# Patient Record
Sex: Male | Born: 1955 | Race: White | Hispanic: No | Marital: Married | Smoking: Current every day smoker
Health system: Southern US, Community
[De-identification: ages and names within clinical notes are randomized; demographics above are authoritative.]

## PROBLEM LIST (undated history)

## (undated) DIAGNOSIS — Z889 Allergy status to unspecified drugs, medicaments and biological substances status: Secondary | ICD-10-CM

## (undated) DIAGNOSIS — I10 Essential (primary) hypertension: Secondary | ICD-10-CM

## (undated) DIAGNOSIS — E785 Hyperlipidemia, unspecified: Secondary | ICD-10-CM

## (undated) DIAGNOSIS — K219 Gastro-esophageal reflux disease without esophagitis: Secondary | ICD-10-CM

## (undated) DIAGNOSIS — M1990 Unspecified osteoarthritis, unspecified site: Secondary | ICD-10-CM

## (undated) HISTORY — PX: TESTICLE SURGERY: SHX794

## (undated) HISTORY — PX: CATARACT EXTRACTION, BILATERAL: SHX1313

## (undated) HISTORY — PX: HERNIA REPAIR: SHX51

## (undated) HISTORY — PX: KNEE ARTHROSCOPY: SUR90

## (undated) HISTORY — DX: Hyperlipidemia, unspecified: E78.5

## (undated) HISTORY — PX: VASECTOMY: SHX75

---

## 2010-12-01 HISTORY — PX: JOINT REPLACEMENT: SHX530

## 2010-12-26 LAB — COMPREHENSIVE METABOLIC PANEL
ALT: 32 U/L (ref 0–53)
Alkaline Phosphatase: 59 U/L (ref 39–117)
BUN: 10 mg/dL (ref 6–23)
Chloride: 105 mEq/L (ref 96–112)
Glucose, Bld: 96 mg/dL (ref 70–99)
Potassium: 4.1 mEq/L (ref 3.5–5.1)
Sodium: 140 mEq/L (ref 135–145)
Total Bilirubin: 0.8 mg/dL (ref 0.3–1.2)
Total Protein: 7.2 g/dL (ref 6.0–8.3)

## 2010-12-26 LAB — URINALYSIS, ROUTINE W REFLEX MICROSCOPIC
Bilirubin Urine: NEGATIVE
Hgb urine dipstick: NEGATIVE
Ketones, ur: NEGATIVE mg/dL
Nitrite: NEGATIVE
Specific Gravity, Urine: 1.019 (ref 1.005–1.030)
Urine Glucose, Fasting: NEGATIVE mg/dL
pH: 6 (ref 5.0–8.0)

## 2010-12-26 LAB — CBC
HCT: 43.1 % (ref 39.0–52.0)
Hemoglobin: 15.2 g/dL (ref 13.0–17.0)
MCH: 32.3 pg (ref 26.0–34.0)
MCV: 91.7 fL (ref 78.0–100.0)
RBC: 4.7 MIL/uL (ref 4.22–5.81)
WBC: 7.4 10*3/uL (ref 4.0–10.5)

## 2010-12-26 LAB — SURGICAL PCR SCREEN: MRSA, PCR: POSITIVE — AB

## 2010-12-26 LAB — PROTIME-INR: Prothrombin Time: 13.6 seconds (ref 11.6–15.2)

## 2011-01-02 ENCOUNTER — Inpatient Hospital Stay (HOSPITAL_COMMUNITY)
Admission: RE | Admit: 2011-01-02 | Discharge: 2011-01-05 | DRG: 470 | Disposition: A | Discharge status: Home Health | Payer: 59 | Attending: Orthopedic Surgery | Admitting: Orthopedic Surgery

## 2011-01-02 DIAGNOSIS — K219 Gastro-esophageal reflux disease without esophagitis: Secondary | ICD-10-CM | POA: Diagnosis present

## 2011-01-02 DIAGNOSIS — I10 Essential (primary) hypertension: Secondary | ICD-10-CM | POA: Diagnosis present

## 2011-01-02 DIAGNOSIS — F17200 Nicotine dependence, unspecified, uncomplicated: Secondary | ICD-10-CM | POA: Diagnosis present

## 2011-01-02 DIAGNOSIS — M1710 Unilateral primary osteoarthritis, unspecified knee: Principal | ICD-10-CM | POA: Diagnosis present

## 2011-01-02 DIAGNOSIS — E669 Obesity, unspecified: Secondary | ICD-10-CM | POA: Diagnosis present

## 2011-01-02 DIAGNOSIS — E7800 Pure hypercholesterolemia, unspecified: Secondary | ICD-10-CM | POA: Diagnosis present

## 2011-01-02 LAB — ABO/RH: ABO/RH(D): A POS

## 2011-01-02 LAB — TYPE AND SCREEN: ABO/RH(D): A POS

## 2011-01-03 LAB — CBC
Hemoglobin: 12.3 g/dL — ABNORMAL LOW (ref 13.0–17.0)
MCHC: 34.1 g/dL (ref 30.0–36.0)
RDW: 12.8 % (ref 11.5–15.5)
WBC: 10.5 10*3/uL (ref 4.0–10.5)

## 2011-01-03 LAB — BASIC METABOLIC PANEL
Calcium: 8.6 mg/dL (ref 8.4–10.5)
GFR calc Af Amer: >60 mL/min (ref >60)
GFR calc non Af Amer: >60 mL/min (ref >60)
Potassium: 4 mEq/L (ref 3.5–5.1)
Sodium: 137 mEq/L (ref 135–145)

## 2011-01-04 LAB — CBC
Hemoglobin: 12.3 g/dL — ABNORMAL LOW (ref 13.0–17.0)
RBC: 3.93 MIL/uL — ABNORMAL LOW (ref 4.22–5.81)

## 2011-01-04 LAB — BASIC METABOLIC PANEL
CO2: 28 mEq/L (ref 19–32)
Chloride: 102 mEq/L (ref 96–112)
GFR calc Af Amer: >60 mL/min (ref >60)
Potassium: 4.3 mEq/L (ref 3.5–5.1)
Sodium: 136 mEq/L (ref 135–145)

## 2011-01-05 LAB — CBC
HCT: 33.6 % — ABNORMAL LOW (ref 39.0–52.0)
Hemoglobin: 11.5 g/dL — ABNORMAL LOW (ref 13.0–17.0)
MCH: 31.3 pg (ref 26.0–34.0)
MCV: 91.3 fL (ref 78.0–100.0)
RBC: 3.68 MIL/uL — ABNORMAL LOW (ref 4.22–5.81)

## 2011-01-15 NOTE — Op Note (Signed)
NAMEOLAND, ARQUETTE                ACCOUNT NO.:  [id number]  MEDICAL RECORD NO.:  [id number]           PATIENT TYPE:  I  LOCATION:  0006                         FACILITY:  [facility]  PHYSICIAN:  Ollen Gross, M.D.    DATE OF BIRTH:  March 22, 1956  DATE OF PROCEDURE:  01/02/2011 DATE OF DISCHARGE:                              OPERATIVE REPORT   PREOPERATIVE DIAGNOSIS:  Osteoarthritis, left knee.  POSTOPERATIVE DIAGNOSIS:  Osteoarthritis, left knee.  PROCEDURE:  Left total knee arthroplasty.  SURGEON:  Ollen Gross, M.D.  ASSISTANT:  Rozell Searing, PA-C  ANESTHESIA:  Spinal, then conversion to general.  ESTIMATED BLOOD LOSS:  Minimal.  DRAINS:  Hemovac x1.  TOURNIQUET TIME:  46 minutes at 300 mmHg.  COMPLICATIONS:  None.  CONDITION:  Stable to recovery.  CLINICAL NOTE:  Jeremy Calderon is a 55 year old male with advanced end-stage arthritis of his left knee with progressively worsening pain and dysfunction.  He has failed nonoperative management and presents for left total knee arthroplasty.  PROCEDURE IN DETAIL:  After successful administration of spinal anesthetic, a tourniquet was placed on the left thigh, left lower extremity prepped and draped in the usual sterile fashion.  The left lower extremity is wrapped in Esmarch, knee flexed, tourniquet inflated to 300 mmHg.  I attempted to pinch the skin to see if the spinal was working.  It appeared to be working; however, when I made the midline incision, he felt it and had severe pain.  He subsequently converted over to a general anesthetic.  We then completed the skin incision with a 10 blade through subcutaneous tissue to the extensor mechanism.  Fresh blade was used to make a medial parapatellar arthrotomy.  Soft tissue on the proximal medial tibia subperiosteally elevated to the joint line with the knife into the semimembranosus bursa with a Cobb elevator. Soft tissue laterally is elevated with attention being paid to avoid  the patellar tendon on tibial tubercle.  Patella was everted, knee flexed 90 degrees, and ACL and PCL removed.  Drill was used create a starting hole in the distal femur, and the canal was thoroughly irrigated.  The 5- degree left valgus alignment guide is placed and the distal femoral cutting blocks pinned to remove 11 mm off the distal femur.  Distal femoral resection is made with an oscillating saw.  Sizing blocks placed, size 4 is most appropriate.  Rotations marked off the epicondylar axis.  Size 4 cutting blocks placed and the anterior- posterior and chamfer cuts made.  The tibia subluxed forward and the menisci are removed.  Extramedullary tibial alignment guide is placed referencing proximally at the medial aspect of the tibial tubercle and distally along the second metatarsal axis and tibial crest.  The block is pinned to remove 2 mm off the more deficient medial side.  Tibial resection is made with an oscillating saw.  Size 5 was the most appropriate tibial component, and the proximal tibia was prepared to modular drill and keel punch for the size 5. Femoral preparation is completed with the intercondylar cut for the size 4.  Size 5 mobile bearing tibial trial, size 4  posterior stabilized femoral trial, and a 12.5 mm posterior stabilized rotating platform insert trial are placed.  With the 12.5, full extension is achieved with excellent varus-valgus and anterior-posterior balance throughout full range of motion.  Patella was everted, thickness measured to be 25 mm.  Freehand resection taken to 13 mm, 41 template is placed, lug holes were drilled, trial patella was placed and it tracks normally.  Osteophytes removed off the posterior femur with the trial in place.  All trials were removed and the cut bone surfaces are prepared with pulsatile lavage. Cement was mixed and once ready for implantation, the size 5 mobile bearing tibial tray, size 4 posterior stabilized femur, and  41 patella were cemented into place, and the patella was held with a clamp.  Trial 12.5 inserts placed, knee held in full extension.  All extruded cement removed.  When the cement was fully hardened, then the permanent 12.5 mm posterior stabilized rotating platform insert is placed in the tibial tray.  Wound is copiously irrigated with saline solution and the arthrotomy closed over Hemovac drain with interrupted #1 PDS.  Flexion against gravity is 140 degrees.  Patella tracks normally.  Tourniquet was then released at the total time of 46 minutes.  Subcutaneous was then closed with interrupted 2-0 Vicryl, subcuticular running 4-0 Monocryl.  Catheter for Marcaine pain pump is placed and the pump is initiated.  Incision was then cleaned and dried, and Steri-Strips and a bulky sterile dressing were applied.  He was then placed into a knee immobilizer, awakened, and transported to recovery in stable condition.     Ollen Gross, M.D.     FA/MEDQ  D:  01/02/2011  T:  01/02/2011  Job:  [id number]  Electronically Signed by Ollen Gross M.D. on 01/15/2011 02:54:09 PM

## 2011-01-17 NOTE — H&P (Signed)
Jeremy Calderon, Jeremy Calderon NO.:  [id number]  MEDICAL RECORD NO.:  [id number]          PATIENT TYPE:  LOCATION:                                 FACILITY:  PHYSICIAN:  Ollen Gross, M.D.         DATE OF BIRTH:  DATE OF ADMISSION:  01/02/2011 DATE OF DISCHARGE:                             HISTORY & PHYSICAL   CHIEF COMPLAINT:  Left knee pain.  HISTORY OF PRESENT ILLNESS:  The patient is a 55 year old male who has been seen by Dr. Lequita Halt for ongoing left knee problems.  His knee pain has progressively gotten worse with time.  He has known end-stage arthritis noted be bone on bone.  He is felt be a good candidate for surgery.  Risks and benefits of this have been discussed and he elects to proceed with surgery.  ALLERGIES:  No known drug allergies.  CURRENT MEDICATIONS:  Azor, probiotic, omeprazole, Crestor, B12.  He also has stopped his supplements including Equalactin, fish oil, glucosamine chondroitin, saw palmetto, and aspirin.  PAST MEDICAL HISTORY:  Hypertension, hypercholesterolemia, cataracts status post removal, reflux disease, esophageal strictures requiring dilatation, diverticulosis, history of thrombocytopenia.  PAST SURGICAL HISTORY:  Stomach hernia repair in 1985, arthroscopic knee surgery in the 1980s, testicular torsion knot excision, cataract removal, vasectomy EGD and colonoscopy.  FAMILY HISTORY:  Father deceased at age 44 with hypertension and heart attack.  Mother living at 76 with hypertension, thyroid disease, sleep apnea.  He has a brother living with hypertension, another brother with heart attack.  SOCIAL HISTORY:  Married.  Works as a Nutritional therapist.  Current smoker, smoked for 20 years, stopped 15 years ago, started back up 4 years ago.  No alcohol.  Wife and daughter will be assisting with care after surgery. He has 5 steps entering his home, does have a living will.  REVIEW OF SYSTEMS:  GENERAL:  No fevers, chills, night sweats.   NEURO: No seizures, syncope, or paralysis.  RESPIRATORY:  No shortness breath, productive cough or hemoptysis.  RESPIRATORY:  Little bit of smoker's cough.  No productive cough or hemoptysis.  CARDIOVASCULAR:  No chest pain, no orthopnea.  GI:  No nausea, vomiting, diarrhea, or constipation.  Occasional reflux.  GU: Little bit of frequency.  No dysuria or hematuria.  MUSCULOSKELETAL:  Knee pain.  PHYSICAL EXAMINATION:  VITAL SIGNS:  Pulse 76, respirations 14, blood pressure 146/82. GENERAL:  A 55 year old white male, well nourished, well developed, in no acute distress.  He is alert, oriented, cooperative.  He is currently accompanied by his wife. HEENT:  Normocephalic, atraumatic.  Pupils round are reactive.  EOMs intact. NECK:  Supple. CHEST:  He has little bit of faint rhonchi on the left sided chest but does clear with coughing. HEART:  Regular rate and rhythm without murmur. ABDOMEN:  Soft, nontender.  Bowel sounds present. RECTAL:  Not done, not pertinent to present illness. BREASTS:  Not done, not pertinent to present illness. GENITALIA:  Not done, not pertinent to present illness. EXTREMITIES:  Left knee range of motion 5 to 110, marked crepitus, little bit of pseudolaxity but no true  instability.  IMPRESSION:  Osteoarthritis, left knee.  PLAN:  The patient admitted to [facility], undergo left total knee replacement arthroplasty.  Surgery will be performed by Dr. Ollen Gross.     Alexzandrew L. Julien Girt, P.A.C.   ______________________________ Ollen Gross, M.D.    ALP/MEDQ  D:  01/09/2011  T:  01/09/2011  Job:  [id number]  cc:   Feliciana Rossetti, MD Fax: [phone]  Electronically Signed by Patrica Duel P.A.C. on 01/16/2011 11:01:00 AM Electronically Signed by Ollen Gross M.D. on 01/17/2011 08:50:00 AM

## 2011-02-24 NOTE — Discharge Summary (Signed)
NAMERIGEL, FILSINGER                ACCOUNT NO.:  [id number]  MEDICAL RECORD NO.:  [id number]           PATIENT TYPE:  I  LOCATION:  1620                         FACILITY:  [facility]  PHYSICIAN:  Ollen Gross, M.D.    DATE OF BIRTH:  22-Aug-1956  DATE OF ADMISSION:  01/02/2011 DATE OF DISCHARGE:  01/05/2011                              DISCHARGE SUMMARY   ADMITTING DIAGNOSES: 1. Osteoarthritis, left knee. 2. Hypertension. 3. Hypercholesterolemia. 4. Cataracts. 5. Reflux disease. 6. Esophageal strictures, requiring dilatation. 7. Diverticulosis. 8. History of thrombocytopenia.  DISCHARGE DIAGNOSES: 1. Osteoarthritis, left knee, status post left total knee replacement     arthroplasty. 2. Hypertension. 3. Hypercholesterolemia. 4. Cataracts. 5. Reflux disease. 6. Esophageal strictures, requiring dilatation. 7. Diverticulosis. 8. History of thrombocytopenia.  PROCEDURE:  January 02, 2011, left total knee.  SURGEON:  Ollen Gross, M.D.  ASSISTANT:  Rozell Searing, [facility].  ANESTHESIA:  Spinal, which had to be converted to a general.  TOURNIQUET TIME:  46 minutes.  CONSULTATIONS:  None.  BRIEF HISTORY:  Jeremy Calderon is a 55 year old male with end-stage advanced arthritis of left knee, progressive worsening pain and dysfunction, now presents for total knee arthroplasty.  LABORATORY DATA:  Preop CBC showed a hemoglobin of 15.2, hematocrit of 43.1, white cell count 7.4, platelets 167.  Postop hemoglobin down to 12.3 and then last hemoglobin 11.5 and hematocrit 33.6.  PT/INR 13.6/1.02 with a PTT of 33.  Chem panel on admission all within normal limits.  Serial BMETs were followed for 48 hours.  Electrolytes remained within normal limits.  Glucose did go up to 96 to 123, back down to 117.  HOSPITAL COURSE:  The patient was admitted to [facility], taken to OR, underwent above-stated procedure without complication.  The patient tolerated the procedure well and later  transferred to the recovery room and then to orthopedic floor.  Started on p.o. and IV analgesics for pain control following surgery.  Given 24 hours postop antibiotics.  Actually, doing pretty well on the morning of day #1, started therapy, getting up.  Hemoglobin was 12.3.  Hemovac drain was pulled.  He was started back on his home meds.  Blood pressure was stable.  By day #2, his hemoglobin was stable at 12.  Dressing change and incision looked good.  Wound was healing well.  By day #3, he was meeting his goals, tolerating his meds, and was discharged home at that point.  DISCHARGE PLAN: 1. The patient was discharged home on January 05, 2011. 2. Discharge diagnoses, please see above. 3. Discharge meds, Robaxin, Percocet, Xarelto, continue baby aspirin,     Azor, Crestor, and loratadine.  DIET:  Heart-healthy diet.  ACTIVITY:  Weightbearing as tolerated, total knee protocol.  Home health PT, home nursing.  FOLLOWUP:  2 weeks.  DISPOSITION:  Home.  CONDITION UPON DISCHARGE:  Improved.     Alexzandrew L. Julien Girt, P.A.C.   ______________________________ Ollen Gross, M.D.    ALP/MEDQ  D:  02/06/2011  T:  02/07/2011  Job:  [id number]  cc:   Feliciana Rossetti, MD Fax: [phone]  Electronically Signed by Patrica Duel P.A.C. on 02/21/2011  10:27:19 AM Electronically Signed by Ollen Gross M.D. on 02/24/2011 07:10:16 AM

## 2013-07-14 ENCOUNTER — Other Ambulatory Visit: Payer: Self-pay | Admitting: Orthopedic Surgery

## 2013-07-14 NOTE — Progress Notes (Signed)
Need orders in EPIC.  Surgery scheduled for 08/03/13.  Thank You.  

## 2013-07-20 ENCOUNTER — Encounter (HOSPITAL_COMMUNITY): Payer: Self-pay | Admitting: Pharmacy Technician

## 2013-07-26 ENCOUNTER — Encounter (HOSPITAL_COMMUNITY): Payer: Self-pay

## 2013-07-26 ENCOUNTER — Encounter (HOSPITAL_COMMUNITY)
Admission: RE | Admit: 2013-07-26 | Discharge: 2013-07-26 | Disposition: A | Discharge status: Home / Self Care | Payer: 59 | Source: Ambulatory Visit | Attending: Orthopedic Surgery | Admitting: Orthopedic Surgery

## 2013-07-26 ENCOUNTER — Ambulatory Visit (HOSPITAL_COMMUNITY)
Admission: RE | Admit: 2013-07-26 | Discharge: 2013-07-26 | Disposition: A | Discharge status: Home / Self Care | Payer: 59 | Source: Ambulatory Visit | Attending: Orthopedic Surgery | Admitting: Orthopedic Surgery

## 2013-07-26 DIAGNOSIS — Z01810 Encounter for preprocedural cardiovascular examination: Secondary | ICD-10-CM | POA: Insufficient documentation

## 2013-07-26 DIAGNOSIS — Z01812 Encounter for preprocedural laboratory examination: Secondary | ICD-10-CM | POA: Insufficient documentation

## 2013-07-26 DIAGNOSIS — R05 Cough: Secondary | ICD-10-CM | POA: Insufficient documentation

## 2013-07-26 DIAGNOSIS — Z01818 Encounter for other preprocedural examination: Secondary | ICD-10-CM | POA: Insufficient documentation

## 2013-07-26 DIAGNOSIS — IMO0002 Reserved for concepts with insufficient information to code with codable children: Secondary | ICD-10-CM | POA: Insufficient documentation

## 2013-07-26 DIAGNOSIS — F17200 Nicotine dependence, unspecified, uncomplicated: Secondary | ICD-10-CM | POA: Insufficient documentation

## 2013-07-26 DIAGNOSIS — R059 Cough, unspecified: Secondary | ICD-10-CM | POA: Insufficient documentation

## 2013-07-26 DIAGNOSIS — X58XXXA Exposure to other specified factors, initial encounter: Secondary | ICD-10-CM | POA: Insufficient documentation

## 2013-07-26 HISTORY — DX: Essential (primary) hypertension: I10

## 2013-07-26 HISTORY — DX: Gastro-esophageal reflux disease without esophagitis: K21.9

## 2013-07-26 HISTORY — DX: Unspecified osteoarthritis, unspecified site: M19.90

## 2013-07-26 HISTORY — DX: Allergy status to unspecified drugs, medicaments and biological substances: Z88.9

## 2013-07-26 LAB — CBC
HCT: 42.8 % (ref 39.0–52.0)
Hemoglobin: 14.9 g/dL (ref 13.0–17.0)
MCHC: 34.8 g/dL (ref 30.0–36.0)
RBC: 4.65 MIL/uL (ref 4.22–5.81)
WBC: 6.8 10*3/uL (ref 4.0–10.5)

## 2013-07-26 LAB — BASIC METABOLIC PANEL
BUN: 9 mg/dL (ref 6–23)
Chloride: 103 mEq/L (ref 96–112)
GFR calc non Af Amer: >90 mL/min (ref >90)
Glucose, Bld: 98 mg/dL (ref 70–99)
Potassium: 4.5 mEq/L (ref 3.5–5.1)
Sodium: 138 mEq/L (ref 135–145)

## 2013-07-26 LAB — SURGICAL PCR SCREEN: Staphylococcus aureus: NEGATIVE

## 2013-07-26 NOTE — Pre-Procedure Instructions (Addendum)
07-26-13 EKG/ CXR done today. 07-27-13 1615 Patient notified surgery time changed to 1330pm , arrive by 1100 am, Stop clear liquids at 0700am-other instructions unchanged. W. Kennon Portela 07-29-13 1400 Patient notified surgery time change again to 1630pm, arrive by 1400 pm, Stop clear liquids at 1000 am.- other instructions same.W. Kennon Portela

## 2013-07-26 NOTE — Patient Instructions (Addendum)
20 [name]  07/26/2013   Your procedure is scheduled on:   08-03-2013  Report to [facility] at   1:00      PM.  Call this number if you have problems the morning of surgery: [phone]  Or Presurgical Testing [phone](Jovanie Verge)      Do not eat food:After Midnight.  May have clear liquids:up to 6 Hours before arrival. Nothing after : 0900 AM  Clear liquids include soda, tea, black coffee, apple or grape juice, broth.  Take these medicines the morning of surgery with A SIP OF WATER: Amlodipine. Omeprazole. Loratadine.    Do not wear jewelry, make-up or nail polish.  Do not wear lotions, powders, or perfumes. You may wear deodorant.  Do not shave 12 hours prior to first CHG shower(legs and under arms).(face and neck okay.)  Do not bring valuables to the hospital.  Contacts, dentures or bridgework,body piercing,  may not be worn into surgery.  Leave suitcase in the car. After surgery it may be brought to your room.  For patients admitted to the hospital, checkout time is 11:00 AM the day of discharge.   Patients discharged the day of surgery will not be allowed to drive home. Must have responsible person with you x 24 hours once discharged.  Name and phone number of your driver: Kaynen Minner,, spouse [phone] cell   Special Instructions: CHG(Chlorhedine 4%-"Hibiclens","Betasept","Aplicare") Shower Use Special Wash: see special instructions.(avoid face and genitals)   Please read over the following fact sheets that you were given: MRSA information,Incentive Spirometry Instruction.    Failure to follow these instructions may result in Cancellation of your surgery.   Patient signature_______________________________________________________

## 2013-07-26 NOTE — Progress Notes (Signed)
8-26-14Your Pt has screened with an elevated risk for obstructive sleep apnea using the Stop-Bang tool during a presurgical  Visit. A score of four or greater is an elevated risk.

## 2013-08-02 NOTE — H&P (Signed)
  CC- SLYVESTER LATONA is a 57 y.o. male who presents with right knee pain.  HPI- . Knee Pain: Patient presents with knee pain involving the  right knee. Onset of the symptoms was several months ago. Inciting event: none known. Current symptoms include giving out, pain located medially and stiffness. Pain is aggravated by lateral movements, pivoting, rising after sitting and squatting.  Patient has had no prior knee problems. Evaluation to date: MRI: abnormal medial meniscal tear. Treatment to date: none.  Past Medical History  Diagnosis Date  . Hypertension   . H/O seasonal allergies     tx. OTC Loratadine  . GERD (gastroesophageal reflux disease)   . Arthritis     knees, shoulders, hips, neck.s/p LTKA-'12    Past Surgical History  Procedure Laterality Date  . Joint replacement Left     '12  . Cataract extraction, bilateral    . Vasectomy    . Hernia repair      abdominal hernia repair  . Knee arthroscopy Left   . Testicle surgery      remove "lump"    Prior to Admission medications   Medication Sig Start Date End Date Taking? Authorizing Provider  amLODipine (NORVASC) 10 MG tablet Take 10 mg by mouth every morning.    Historical Provider, MD  aspirin EC 81 MG tablet Take 162 mg by mouth at bedtime.    Historical Provider, MD  cyanocobalamin (,VITAMIN B-12,) 1000 MCG/ML injection Inject 1,000 mcg into the muscle every 30 (thirty) days.    Historical Provider, MD  fish oil-omega-3 fatty acids 1000 MG capsule Take 1 g by mouth 2 (two) times daily.    Historical Provider, MD  glucosamine-chondroitin 500-400 MG tablet Take 1 tablet by mouth 2 (two) times daily.    Historical Provider, MD  loratadine (CLARITIN) 10 MG tablet Take 10 mg by mouth every morning.    Historical Provider, MD  Multiple Vitamin (MULTIVITAMIN WITH MINERALS) TABS tablet Take 1 tablet by mouth daily.    Historical Provider, MD  olmesartan (BENICAR) 40 MG tablet Take 40 mg by mouth every morning.    Historical  Provider, MD  omeprazole (PRILOSEC) 40 MG capsule Take 40 mg by mouth every morning.    Historical Provider, MD  Probiotic Product (PROBIOTIC DAILY PO) Take 1 capsule by mouth at bedtime.    Historical Provider, MD   KNEE EXAM antalgic gait, soft tissue tenderness over medial joint line, no effusion, collateral ligaments intact, negative Lachman sign  Physical Examination: General appearance - alert, well appearing, and in no distress Mental status - alert, oriented to person, place, and time Chest - clear to auscultation, no wheezes, rales or rhonchi, symmetric air entry Heart - normal rate, regular rhythm, normal S1, S2, no murmurs, rubs, clicks or gallops Abdomen - soft, nontender, nondistended, no masses or organomegaly Neurological - alert, oriented, normal speech, no focal findings or movement disorder noted   Asessment/Plan--- Right knee medial meniscal tear- - Plan right knee arthroscopy with meniscal debridement. Procedure risks and potential comps discussed with patient who elects to proceed. Goals are decreased pain and increased function with a high likelihood of achieving both

## 2013-08-03 ENCOUNTER — Encounter (HOSPITAL_COMMUNITY)
Admission: RE | Disposition: A | Discharge status: Home / Self Care | Payer: Self-pay | Source: Ambulatory Visit | Attending: Orthopedic Surgery

## 2013-08-03 ENCOUNTER — Ambulatory Visit (HOSPITAL_COMMUNITY)
Admission: RE | Admit: 2013-08-03 | Discharge: 2013-08-03 | Disposition: A | Discharge status: Home / Self Care | Payer: 59 | Source: Ambulatory Visit | Attending: Orthopedic Surgery | Admitting: Orthopedic Surgery

## 2013-08-03 ENCOUNTER — Encounter (HOSPITAL_COMMUNITY): Payer: Self-pay | Admitting: Anesthesiology

## 2013-08-03 ENCOUNTER — Encounter (HOSPITAL_COMMUNITY): Payer: Self-pay | Admitting: *Deleted

## 2013-08-03 ENCOUNTER — Ambulatory Visit (HOSPITAL_COMMUNITY): Payer: 59 | Admitting: Anesthesiology

## 2013-08-03 DIAGNOSIS — IMO0002 Reserved for concepts with insufficient information to code with codable children: Secondary | ICD-10-CM | POA: Insufficient documentation

## 2013-08-03 DIAGNOSIS — K219 Gastro-esophageal reflux disease without esophagitis: Secondary | ICD-10-CM | POA: Insufficient documentation

## 2013-08-03 DIAGNOSIS — Z79899 Other long term (current) drug therapy: Secondary | ICD-10-CM | POA: Insufficient documentation

## 2013-08-03 DIAGNOSIS — X58XXXA Exposure to other specified factors, initial encounter: Secondary | ICD-10-CM | POA: Insufficient documentation

## 2013-08-03 DIAGNOSIS — I10 Essential (primary) hypertension: Secondary | ICD-10-CM | POA: Insufficient documentation

## 2013-08-03 DIAGNOSIS — Z96659 Presence of unspecified artificial knee joint: Secondary | ICD-10-CM | POA: Insufficient documentation

## 2013-08-03 DIAGNOSIS — S83241A Other tear of medial meniscus, current injury, right knee, initial encounter: Secondary | ICD-10-CM

## 2013-08-03 DIAGNOSIS — S83249A Other tear of medial meniscus, current injury, unspecified knee, initial encounter: Secondary | ICD-10-CM

## 2013-08-03 HISTORY — PX: KNEE ARTHROSCOPY: SHX127

## 2013-08-03 SURGERY — ARTHROSCOPY, KNEE
Anesthesia: General | Site: Knee | Laterality: Right | Wound class: Clean

## 2013-08-03 MED ORDER — PROMETHAZINE HCL 25 MG/ML IJ SOLN: 6.2500 mg | INTRAMUSCULAR | Status: DC | PRN

## 2013-08-03 MED ORDER — DEXAMETHASONE SODIUM PHOSPHATE 10 MG/ML IJ SOLN
10.0000 mg | Freq: Once | INTRAMUSCULAR | Status: AC
  Administered 2013-08-03: 10 mg via INTRAVENOUS

## 2013-08-03 MED ORDER — LACTATED RINGERS IV SOLN: INTRAVENOUS | Status: DC

## 2013-08-03 MED ORDER — KETOROLAC TROMETHAMINE 15 MG/ML IJ SOLN
INTRAMUSCULAR | Status: AC
  Filled 2013-08-03: qty 1

## 2013-08-03 MED ORDER — FENTANYL CITRATE 0.05 MG/ML IJ SOLN: 25.0000 ug | INTRAMUSCULAR | Status: DC | PRN

## 2013-08-03 MED ORDER — LIDOCAINE HCL (CARDIAC) 20 MG/ML IV SOLN
INTRAVENOUS | Status: DC | PRN
  Administered 2013-08-03: 50 mg via INTRAVENOUS

## 2013-08-03 MED ORDER — ACETAMINOPHEN 10 MG/ML IV SOLN
1000.0000 mg | Freq: Once | INTRAVENOUS | Status: AC
  Administered 2013-08-03: 1000 mg via INTRAVENOUS
  Filled 2013-08-03: qty 100

## 2013-08-03 MED ORDER — CEFAZOLIN SODIUM-DEXTROSE 2-3 GM-% IV SOLR
2.0000 g | INTRAVENOUS | Status: AC
  Administered 2013-08-03: 2 g via INTRAVENOUS

## 2013-08-03 MED ORDER — SODIUM CHLORIDE 0.9 % IV SOLN: INTRAVENOUS | Status: DC

## 2013-08-03 MED ORDER — OXYCODONE HCL 5 MG PO TABS: 5.0000 mg | ORAL_TABLET | ORAL | Status: DC | PRN

## 2013-08-03 MED ORDER — CHLORHEXIDINE GLUCONATE 4 % EX LIQD
60.0000 mL | Freq: Once | CUTANEOUS | Status: DC
  Filled 2013-08-03: qty 60

## 2013-08-03 MED ORDER — CEFAZOLIN SODIUM-DEXTROSE 2-3 GM-% IV SOLR
INTRAVENOUS | Status: AC
  Filled 2013-08-03: qty 50

## 2013-08-03 MED ORDER — FENTANYL CITRATE 0.05 MG/ML IJ SOLN
INTRAMUSCULAR | Status: DC | PRN
  Administered 2013-08-03: 25 ug via INTRAVENOUS
  Administered 2013-08-03: 50 ug via INTRAVENOUS

## 2013-08-03 MED ORDER — MIDAZOLAM HCL 5 MG/5ML IJ SOLN
INTRAMUSCULAR | Status: DC | PRN
  Administered 2013-08-03: 2 mg via INTRAVENOUS

## 2013-08-03 MED ORDER — BUPIVACAINE-EPINEPHRINE 0.25% -1:200000 IJ SOLN
INTRAMUSCULAR | Status: DC | PRN
  Administered 2013-08-03: 20 mL

## 2013-08-03 MED ORDER — PROPOFOL 10 MG/ML IV BOLUS
INTRAVENOUS | Status: DC | PRN
  Administered 2013-08-03: 200 mg via INTRAVENOUS

## 2013-08-03 MED ORDER — METHOCARBAMOL 500 MG PO TABS: 500.0000 mg | ORAL_TABLET | Freq: Four times a day (QID) | ORAL | Status: DC

## 2013-08-03 MED ORDER — LACTATED RINGERS IR SOLN
Status: DC | PRN
  Administered 2013-08-03: 3000 mL

## 2013-08-03 MED ORDER — LACTATED RINGERS IV SOLN
INTRAVENOUS | Status: DC | PRN
  Administered 2013-08-03: 15:00:00 via INTRAVENOUS

## 2013-08-03 MED ORDER — BUPIVACAINE-EPINEPHRINE PF 0.25-1:200000 % IJ SOLN
INTRAMUSCULAR | Status: AC
  Filled 2013-08-03: qty 30

## 2013-08-03 MED ORDER — KETOROLAC TROMETHAMINE 30 MG/ML IJ SOLN
15.0000 mg | Freq: Once | INTRAMUSCULAR | Status: AC | PRN
  Administered 2013-08-03: 15 mg via INTRAVENOUS

## 2013-08-03 SURGICAL SUPPLY — 24 items
BANDAGE ELASTIC 6 VELCRO ST LF (GAUZE/BANDAGES/DRESSINGS) ×2 IMPLANT
BLADE 4.2CUDA (BLADE) ×2 IMPLANT
CLOTH BEACON ORANGE TIMEOUT ST (SAFETY) ×2 IMPLANT
CUFF TOURN SGL QUICK 34 (TOURNIQUET CUFF) ×1
CUFF TRNQT CYL 34X4X40X1 (TOURNIQUET CUFF) ×1 IMPLANT
DRAPE U-SHAPE 47X51 STRL (DRAPES) ×2 IMPLANT
DRSG EMULSION OIL 3X3 NADH (GAUZE/BANDAGES/DRESSINGS) ×2 IMPLANT
DURAPREP 26ML APPLICATOR (WOUND CARE) ×2 IMPLANT
GLOVE BIO SURGEON STRL SZ8 (GLOVE) ×2 IMPLANT
GLOVE BIOGEL PI IND STRL 8 (GLOVE) ×2 IMPLANT
GLOVE BIOGEL PI INDICATOR 8 (GLOVE) ×2
GOWN STRL NON-REIN LRG LVL3 (GOWN DISPOSABLE) ×2 IMPLANT
MANIFOLD NEPTUNE II (INSTRUMENTS) ×4 IMPLANT
PACK ARTHROSCOPY WL (CUSTOM PROCEDURE TRAY) ×2 IMPLANT
PACK ICE MAXI GEL EZY WRAP (MISCELLANEOUS) ×6 IMPLANT
PADDING CAST COTTON 6X4 STRL (CAST SUPPLIES) ×2 IMPLANT
POSITIONER SURGICAL ARM (MISCELLANEOUS) ×2 IMPLANT
SET ARTHROSCOPY TUBING (MISCELLANEOUS) ×1
SET ARTHROSCOPY TUBING LN (MISCELLANEOUS) ×1 IMPLANT
SPONGE GAUZE 4X4 12PLY (GAUZE/BANDAGES/DRESSINGS) ×2 IMPLANT
SUT ETHILON 4 0 PS 2 18 (SUTURE) ×2 IMPLANT
TOWEL OR 17X26 10 PK STRL BLUE (TOWEL DISPOSABLE) ×2 IMPLANT
WAND 90 DEG TURBOVAC W/CORD (SURGICAL WAND) ×2 IMPLANT
WRAP KNEE MAXI GEL POST OP (GAUZE/BANDAGES/DRESSINGS) ×4 IMPLANT

## 2013-08-03 NOTE — Interval H&P Note (Signed)
History and Physical Interval Note:  08/03/2013 3:28 PM  Jeremy Calderon  has presented today for surgery, with the diagnosis of RIGHT KNEE MEDIAL MENISCUS TEAR  The various methods of treatment have been discussed with the patient and family. After consideration of risks, benefits and other options for treatment, the patient has consented to  Procedure(s): RIGHT ARTHROSCOPY KNEE WITH DEBRIDEMENT (Right) as a surgical intervention .  The patient's history has been reviewed, patient examined, no change in status, stable for surgery.  I have reviewed the patient's chart and labs.  Questions were answered to the patient's satisfaction.     Loanne Drilling

## 2013-08-03 NOTE — Op Note (Addendum)
Preoperative diagnosis-  Right knee medial meniscal tear  Postoperative diagnosis Right- knee medial meniscal tear   Procedure- Right knee arthroscopy with medial meniscal debridement    Surgeon- Gus Rankin. Askari Kinley, MD  Anesthesia-General  EBL-  minimal Complications- None  Condition- PACU - hemodynamically stable.  Brief clinical note- -Jeremy Calderon is a 57 y.o.  male with a several month history of right knee pain and mechanical symptoms. Exam and history suggested medial meniscal tear confirmed by MRI. The patient presents now for arthroscopy and debridement   Procedure in detail -       After successful administration of General anesthetic, a tourmiquet is placed high on the Right  thigh and the Right lower extremity is prepped and draped in the usual sterile fashion. Time out is performed by the surgical team. Standard superomedial and inferolateral portal sites are marked and incisions made with an 11 blade. The inflow cannula is passed through the superomedial portal and camera through the inferolateral portal and inflow is initiated. Arthroscopic visualization proceeds.      The undersurface of the patella and trochlea are visualized and there is mild chondromalacia but no full thickness chondral defects. The medial and lateral gutters are visualized and there are  no loose bodies. Flexion and valgus force is applied to the knee and the medial compartment is entered. A spinal needle is passed into the joint through the site marked for the inferomedial portal. A small incision is made and the dilator passed into the joint. The findings for the medial compartment are degenerative unstable tear of the medial meniscus posterior horn . The meniscus is debrided to a stable base with the baskets and shaver and sealed off with the arthrocare device. There is mild chondromalacia but no full thickness chondral defect    The intercondylar notch is visualized and the ACL appears normal. The lateral  compartment is entered and the findings are normal .      The joint is again inspected and there are no other tears, defects or loose bodies identified. The arthroscopic equipment is then removed from the inferior portals which are closed with interrupted 4-0 nylon. 20 ml of .25% Marcaine with epinephrine are injected through the inflow cannula and the cannula is then removed and the portal closed with nylon. The incisions are cleaned and dried and a bulky sterile dressing is applied. The patient is then awakened and transported to recovery in stable condition.   08/03/2013, 4:21 PM

## 2013-08-03 NOTE — Anesthesia Preprocedure Evaluation (Signed)
Anesthesia Evaluation  Patient identified by MRN, date of birth, ID band Patient awake    Reviewed: Allergy & Precautions, H&P , NPO status , Patient's Chart, lab work & pertinent test results  Airway Mallampati: II TM Distance: <3 FB Neck ROM: Full    Dental no notable dental hx.    Pulmonary Current Smoker,  breath sounds clear to auscultation  + decreased breath sounds      Cardiovascular hypertension, Pt. on medications Rhythm:Regular Rate:Normal     Neuro/Psych negative neurological ROS  negative psych ROS   GI/Hepatic Neg liver ROS, GERD-  Medicated,  Endo/Other  Morbid obesity  Renal/GU negative Renal ROS  negative genitourinary   Musculoskeletal negative musculoskeletal ROS (+)   Abdominal   Peds negative pediatric ROS (+)  Hematology negative hematology ROS (+)   Anesthesia Other Findings   Reproductive/Obstetrics negative OB ROS                           Anesthesia Physical Anesthesia Plan  ASA: III  Anesthesia Plan: General   Post-op Pain Management:    Induction: Intravenous  Airway Management Planned: LMA  Additional Equipment:   Intra-op Plan:   Post-operative Plan:   Informed Consent: I have reviewed the patients History and Physical, chart, labs and discussed the procedure including the risks, benefits and alternatives for the proposed anesthesia with the patient or authorized representative who has indicated his/her understanding and acceptance.   Dental advisory given  Plan Discussed with: CRNA and Surgeon  Anesthesia Plan Comments:         Anesthesia Quick Evaluation

## 2013-08-03 NOTE — Anesthesia Postprocedure Evaluation (Signed)
  Anesthesia Post-op Note  Patient: Jeremy Calderon  Procedure(s) Performed: Procedure(s) (LRB): RIGHT ARTHROSCOPY KNEE WITH DEBRIDEMENT (Right)  Patient Location: PACU  Anesthesia Type: General  Level of Consciousness: awake and alert   Airway and Oxygen Therapy: Patient Spontanous Breathing  Post-op Pain: mild  Post-op Assessment: Post-op Vital signs reviewed, Patient's Cardiovascular Status Stable, Respiratory Function Stable, Patent Airway and No signs of Nausea or vomiting  Last Vitals:  Filed Vitals:   08/03/13 1635  BP:   Pulse: 71  Temp:   Resp: 17    Post-op Vital Signs: stable   Complications: No apparent anesthesia complications 

## 2013-08-03 NOTE — Anesthesia Postprocedure Evaluation (Signed)
  Anesthesia Post-op Note  Patient: Jeremy Calderon  Procedure(s) Performed: Procedure(s) (LRB): RIGHT ARTHROSCOPY KNEE WITH DEBRIDEMENT (Right)  Patient Location: PACU  Anesthesia Type: General  Level of Consciousness: awake and alert   Airway and Oxygen Therapy: Patient Spontanous Breathing  Post-op Pain: mild  Post-op Assessment: Post-op Vital signs reviewed, Patient's Cardiovascular Status Stable, Respiratory Function Stable, Patent Airway and No signs of Nausea or vomiting  Last Vitals:  Filed Vitals:   08/03/13 1635  BP:   Pulse: 71  Temp:   Resp: 17    Post-op Vital Signs: stable   Complications: No apparent anesthesia complications

## 2013-08-03 NOTE — Addendum Note (Signed)
Addendum created 08/03/13 1710 by Paris Lore, CRNA   Modules edited: Anesthesia Flowsheet, Anesthesia LDA

## 2013-08-03 NOTE — Transfer of Care (Signed)
Immediate Anesthesia Transfer of Care Note  Patient: Jeremy Calderon  Procedure(s) Performed: Procedure(s) (LRB): RIGHT ARTHROSCOPY KNEE WITH DEBRIDEMENT (Right)  Patient Location: PACU  Anesthesia Type: General  Level of Consciousness: sedated, patient cooperative and responds to stimulaton  Airway & Oxygen Therapy: Patient Spontanous Breathing and Patient connected to face mask oxgen  Post-op Assessment: Report given to PACU RN and Post -op Vital signs reviewed and stable  Post vital signs: Reviewed and stable  Complications: No apparent anesthesia complications

## 2013-08-04 ENCOUNTER — Encounter (HOSPITAL_COMMUNITY): Payer: Self-pay | Admitting: Orthopedic Surgery

## 2014-07-17 IMAGING — CR DG CHEST 2V
2 series · 2 of 2 positions shown · non-contrast
Comparison: 12/12/2010

CLINICAL DATA: Preop for right knee surgery.  Smoker with cough.

CHEST - 2 VIEW

[w chest pa]
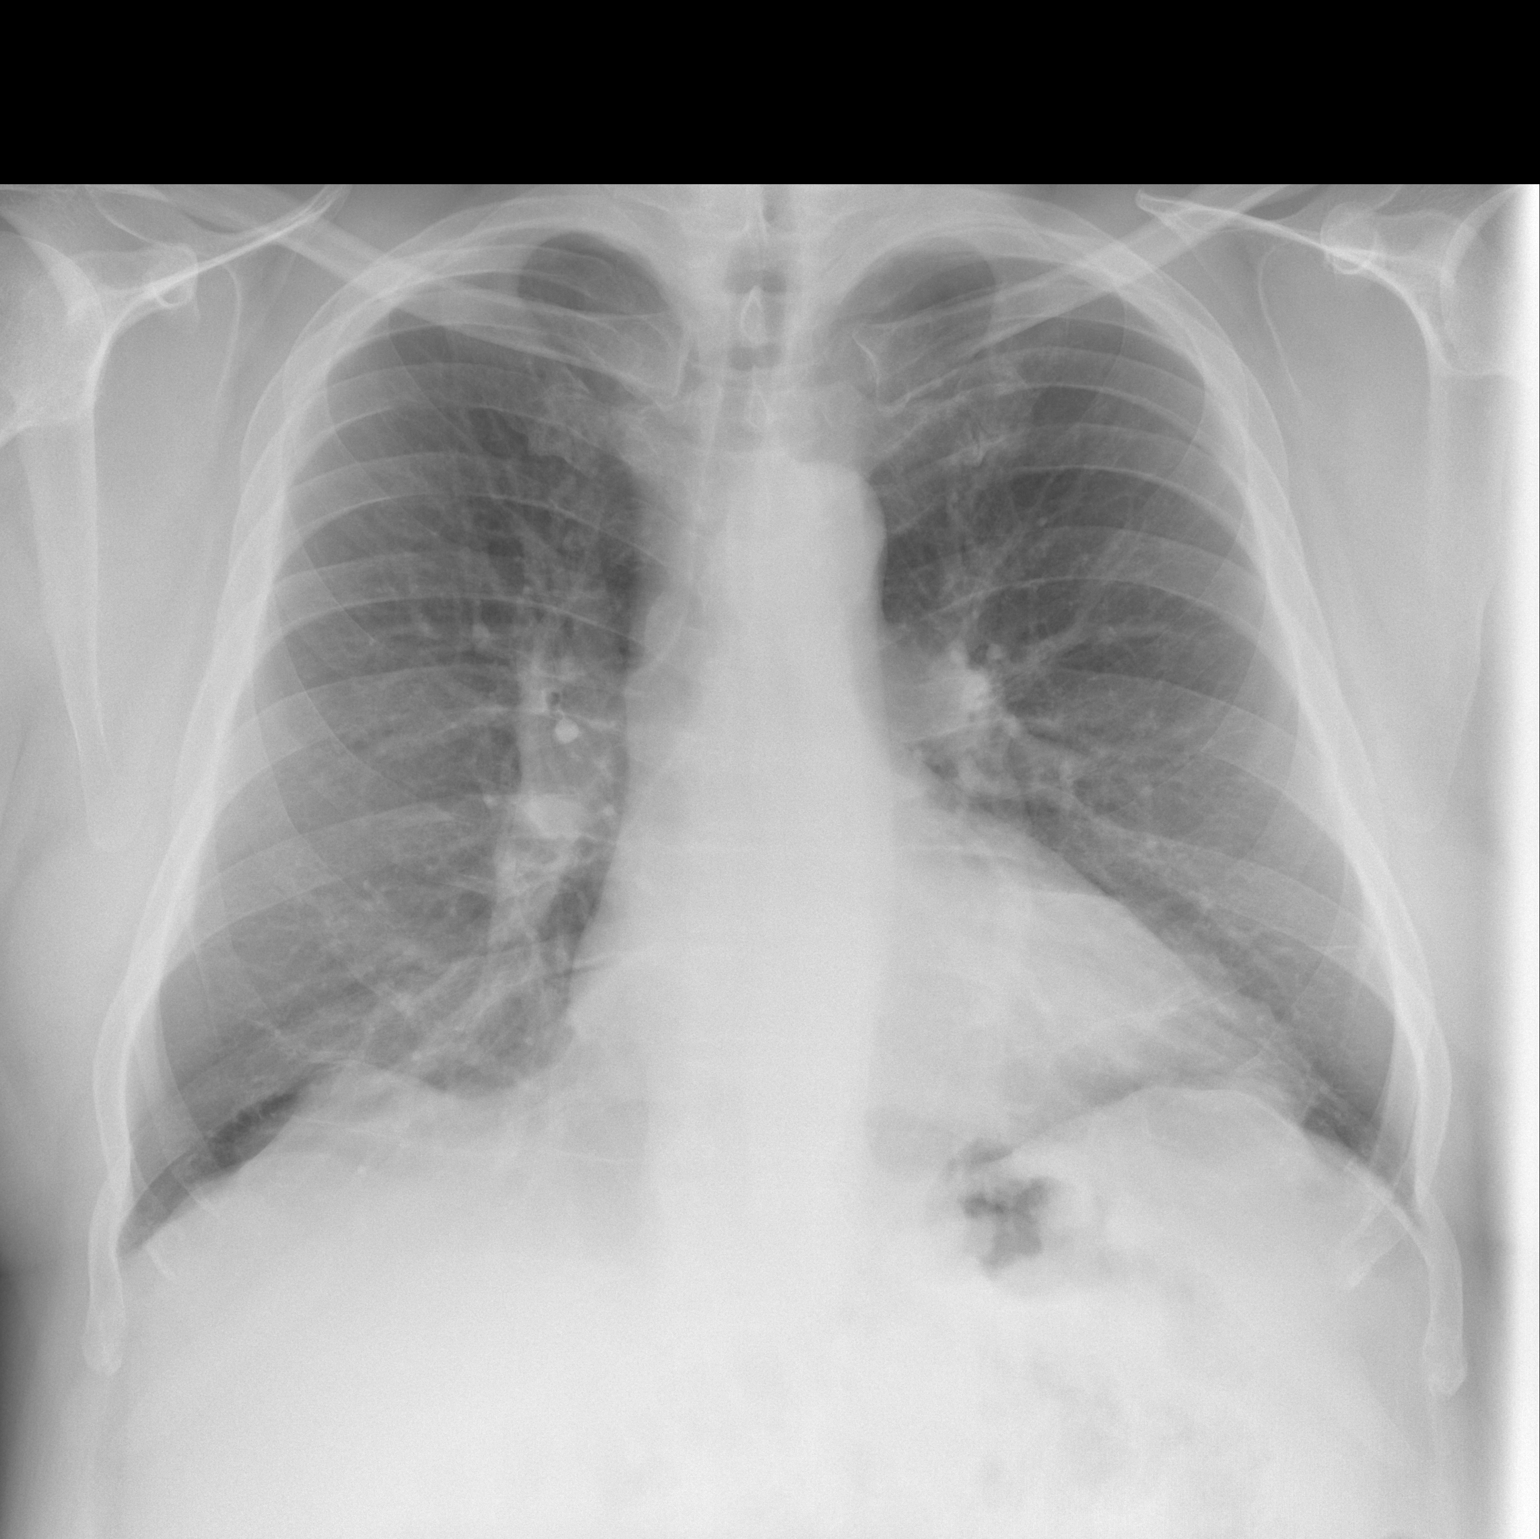

[w chest lat]
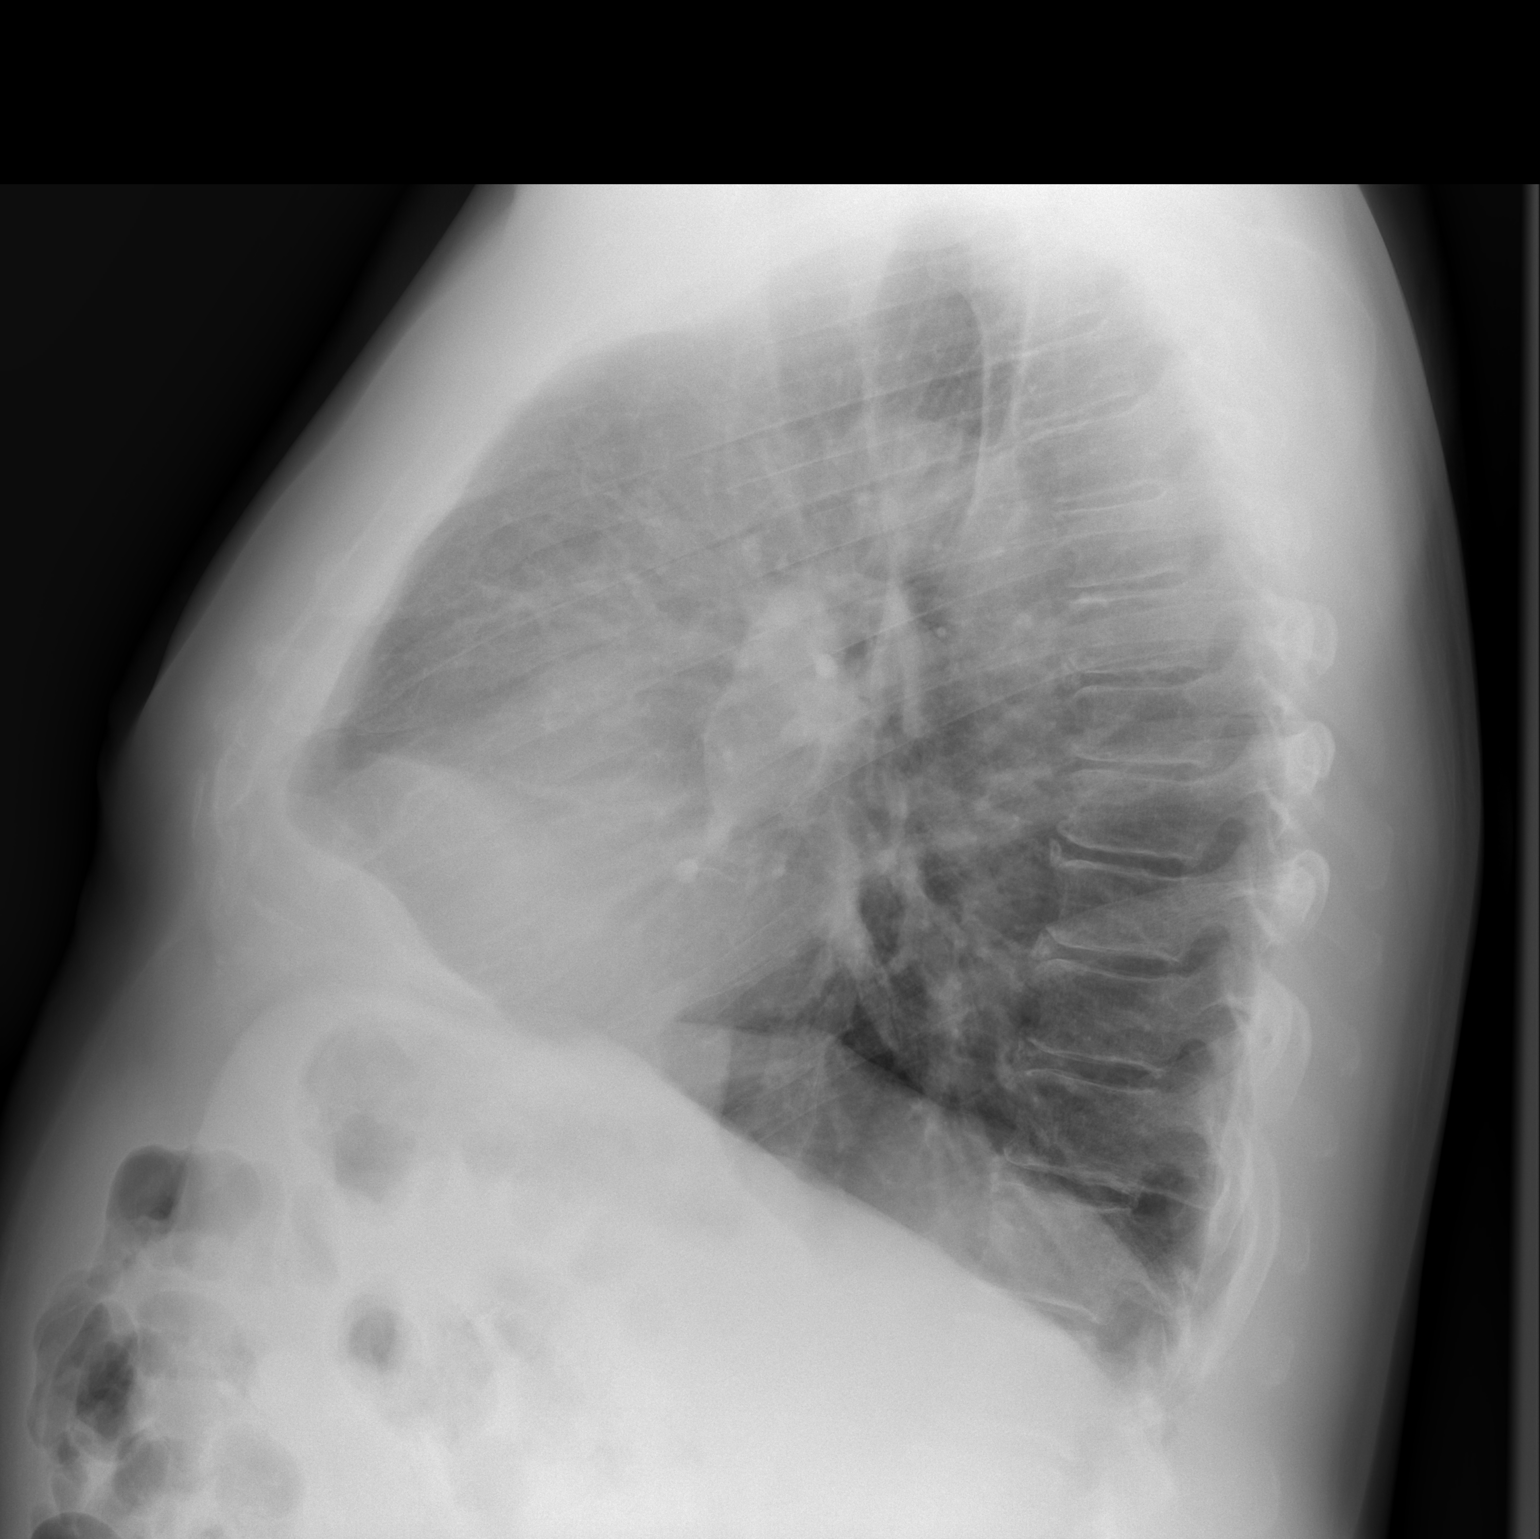

[2 of 2 positions shown; findings below may reference images not displayed]

FINDINGS: Hyperinflation on the lateral view. Midline trachea.
Borderline cardiomegaly.     Mediastinal contours otherwise within
normal limits.  No pleural effusion or pneumothorax.  Slight
increase in bibasilar volume loss and scarring.  Lungs otherwise
clear.
IMPRESSION: Hyperinflation with bibasilar scarring. No acute findings.

## 2018-01-21 NOTE — Progress Notes (Signed)
Need orders in epic for 4-1 surgery 

## 2018-01-24 ENCOUNTER — Ambulatory Visit: Payer: Self-pay | Admitting: Orthopedic Surgery

## 2018-02-22 NOTE — Patient Instructions (Signed)
Jeremy MarinerDavid R Calderon  02/22/2018   Your procedure is scheduled on: Monday 03/01/2018  [facility]  [facility]              Report to admitting at  1010 AM    Call this number if you have problems the morning of surgery [phone]    Remember: Do not eat food or drink liquids :After Midnight.     Take these medicines the morning of surgery with A SIP OF WATER: Amlodipine (Norvasc), Omeprazole (Prilosec) , use eye drops if needed Flonase nasal spray if needed ,                                 You may not have any metal on your body including hair pins and              piercings  Do not wear jewelry, make-up, lotions, powders or perfumes, deodorant             Do not wear nail polish.  Do not shave  48 hours prior to surgery.              Men may shave face and neck.   Do not bring valuables to the hospital. Jeremy Calderon IS NOT             RESPONSIBLE   FOR VALUABLES.  Contacts, dentures or bridgework may not be worn into surgery.  Leave suitcase in the car. After surgery it may be brought to your room.                Please read over the following fact sheets you were given: _____________________________________________________________________             [facility] - Preparing [facility] Before surgery, you can play an important role.  Because skin is not sterile, your skin needs to be as free of germs as possible.  You can reduce the number of germs on your skin by washing with CHG (chlorahexidine gluconate) soap before surgery.  CHG is an antiseptic cleaner which kills germs and bonds with the skin to continue killing germs even after washing. Please DO NOT use if you have an allergy to CHG or antibacterial soaps.  If your skin becomes reddened/irritated stop using the CHG and inform your nurse when you arrive at Short Stay. Do not shave (including legs and underarms) for at least 48 hours prior to the first CHG shower.  You may shave your  face/neck. Please follow these instructions carefully:  1.  Shower with CHG Soap the night before surgery and the  morning of Surgery.  2.  If you choose to wash your hair, wash your hair first as usual with your  normal  shampoo.  3.  After you shampoo, rinse your hair and body thoroughly to remove the  shampoo.                           4.  Use CHG as you would any other liquid soap.  You can apply chg directly  to the skin and wash                       Gently with a scrungie or clean washcloth.  5.  Apply the CHG Soap to your body ONLY FROM THE NECK DOWN.   Do not use on face/ open                           Wound or open sores. Avoid contact with eyes, ears mouth and genitals (private parts).                       Wash face,  Genitals (private parts) with your normal soap.             6.  Wash thoroughly, paying special attention to the area where your surgery  will be performed.  7.  Thoroughly rinse your body with warm water from the neck down.  8.  DO NOT shower/wash with your normal soap after using and rinsing off  the CHG Soap.                9.  Pat yourself dry with a clean towel.            10.  Wear clean pajamas.            11.  Place clean sheets on your bed the night of your first shower and do not  sleep with pets. Day of Surgery : Do not apply any lotions/deodorants the morning of surgery.  Please wear clean clothes to the hospital/surgery center.  FAILURE TO FOLLOW THESE INSTRUCTIONS MAY RESULT IN THE CANCELLATION OF YOUR SURGERY PATIENT SIGNATURE_________________________________  NURSE SIGNATURE__________________________________  ________________________________________________________________________   Jeremy Calderon  An incentive spirometer is a tool that can help keep your lungs clear and active. This tool measures how well you are filling your lungs with each breath. Taking long deep breaths may help reverse or decrease the chance of developing breathing  (pulmonary) problems (especially infection) following:  A long period of time when you are unable to move or be active. BEFORE THE PROCEDURE   If the spirometer includes an indicator to show your best effort, your nurse or respiratory therapist will set it to a desired goal.  If possible, sit up straight or lean slightly forward. Try not to slouch.  Hold the incentive spirometer in an upright position. INSTRUCTIONS FOR USE  1. Sit on the edge of your bed if possible, or sit up as far as you can in bed or on a chair. 2. Hold the incentive spirometer in an upright position. 3. Breathe out normally. 4. Place the mouthpiece in your mouth and seal your lips tightly around it. 5. Breathe in slowly and as deeply as possible, raising the piston or the ball toward the top of the column. 6. Hold your breath for 3-5 seconds or for as long as possible. Allow the piston or ball to fall to the bottom of the column. 7. Remove the mouthpiece from your mouth and breathe out normally. 8. Rest for a few seconds and repeat Steps 1 through 7 at least 10 times every 1-2 hours when you are awake. Take your time and take a few normal breaths between deep breaths. 9. The spirometer may include an indicator to show your best effort. Use the indicator as a goal to work toward during each repetition. 10. After each set of 10 deep breaths, practice coughing to be sure your lungs are clear. If you have an incision (the cut made at the time of surgery), support your incision when coughing by placing a pillow or  rolled up towels firmly against it. Once you are able to get out of bed, walk around indoors and cough well. You may stop using the incentive spirometer when instructed by your caregiver.  RISKS AND COMPLICATIONS  Take your time so you do not get dizzy or light-headed.  If you are in pain, you may need to take or ask for pain medication before doing incentive spirometry. It is harder to take a deep breath if you  are having pain. AFTER USE  Rest and breathe slowly and easily.  It can be helpful to keep track of a log of your progress. Your caregiver can provide you with a simple table to help with this. If you are using the spirometer at home, follow these instructions: [place] IF:   You are having difficultly using the spirometer.  You have trouble using the spirometer as often as instructed.  Your pain medication is not giving enough relief while using the spirometer.  You develop fever of 100.5 F (38.1 C) or higher. SEEK IMMEDIATE MEDICAL CARE IF:   You cough up bloody sputum that had not been present before.  You develop fever of 102 F (38.9 C) or greater.  You develop worsening pain at or near the incision site. MAKE SURE YOU:   Understand these instructions.  Will watch your condition.  Will get help right away if you are not doing well or get worse. Document Released: 03/30/2007 Document Revised: 02/09/2012 Document Reviewed: 05/31/2007 ExitCare Patient Information 2014 Exit[employer], [place].   ________________________________________________________________________  WHAT IS A BLOOD TRANSFUSION? Blood Transfusion Information  A transfusion is the replacement of blood or some of its parts. Blood is made up of multiple cells which provide different functions.  Red blood cells carry oxygen and are used for blood loss replacement.  White blood cells fight against infection.  Platelets control bleeding.  Plasma helps clot blood.  Other blood products are available for specialized needs, such as hemophilia or other clotting disorders. BEFORE THE TRANSFUSION  Who gives blood for transfusions?   Healthy volunteers who are fully evaluated to make sure their blood is safe. This is blood bank blood. Transfusion therapy is the safest it has ever been in the practice of medicine. Before blood is taken from a donor, a complete history is taken to make sure that person has  no history of diseases nor engages in risky social behavior (examples are intravenous drug use or sexual activity with multiple partners). The donor's travel history is screened to minimize risk of transmitting infections, such as malaria. The donated blood is tested for signs of infectious diseases, such as HIV and hepatitis. The blood is then tested to be sure it is compatible with you in order to minimize the chance of a transfusion reaction. If you or a relative donates blood, this is often done in anticipation of surgery and is not appropriate for emergency situations. It takes many days to process the donated blood. RISKS AND COMPLICATIONS Although transfusion therapy is very safe and saves many lives, the main dangers of transfusion include:   Getting an infectious disease.  Developing a transfusion reaction. This is an allergic reaction to something in the blood you were given. Every precaution is taken to prevent this. The decision to have a blood transfusion has been considered carefully by your caregiver before blood is given. Blood is not given unless the benefits outweigh the risks. AFTER THE TRANSFUSION  Right after receiving a blood transfusion, you will usually  feel much better and more energetic. This is especially true if your red blood cells have gotten low (anemic). The transfusion raises the level of the red blood cells which carry oxygen, and this usually causes an energy increase.  The nurse administering the transfusion will monitor you carefully for complications. HOME CARE INSTRUCTIONS  No special instructions are needed after a transfusion. You may find your energy is better. Speak with your caregiver about any limitations on activity for underlying diseases you may have. SEEK MEDICAL CARE IF:   Your condition is not improving after your transfusion.  You develop redness or irritation at the intravenous (IV) site. SEEK IMMEDIATE MEDICAL CARE IF:  Any of the following  symptoms occur over the next 12 hours:  Shaking chills.  You have a temperature by mouth above 102 F (38.9 C), not controlled by medicine.  Chest, back, or muscle pain.  People around you feel you are not acting correctly or are confused.  Shortness of breath or difficulty breathing.  Dizziness and fainting.  You get a rash or develop hives.  You have a decrease in urine output.  Your urine turns a dark color or changes to pink, red, or brown. Any of the following symptoms occur over the next 10 days:  You have a temperature by mouth above 102 F (38.9 C), not controlled by medicine.  Shortness of breath.  Weakness after normal activity.  The white part of the eye turns yellow (jaundice).  You have a decrease in the amount of urine or are urinating less often.  Your urine turns a dark color or changes to pink, red, or brown. Document Released: 11/14/2000 Document Revised: 02/09/2012 Document Reviewed: 07/03/2008 [facility] Patient Information 2014 [place], [place].  _______________________________________________________________________

## 2018-02-23 ENCOUNTER — Encounter (HOSPITAL_COMMUNITY)
Admission: RE | Admit: 2018-02-23 | Discharge: 2018-02-23 | Disposition: A | Discharge status: Home / Self Care | Payer: 59 | Source: Ambulatory Visit | Attending: Orthopedic Surgery | Admitting: Orthopedic Surgery

## 2018-02-23 ENCOUNTER — Encounter (HOSPITAL_COMMUNITY): Payer: Self-pay

## 2018-02-23 ENCOUNTER — Other Ambulatory Visit: Payer: Self-pay

## 2018-02-23 DIAGNOSIS — Z01818 Encounter for other preprocedural examination: Secondary | ICD-10-CM | POA: Diagnosis not present

## 2018-02-23 DIAGNOSIS — M1711 Unilateral primary osteoarthritis, right knee: Secondary | ICD-10-CM | POA: Insufficient documentation

## 2018-02-23 DIAGNOSIS — I10 Essential (primary) hypertension: Secondary | ICD-10-CM | POA: Insufficient documentation

## 2018-02-23 LAB — COMPREHENSIVE METABOLIC PANEL
ALK PHOS: 72 U/L (ref 38–126)
ALT: 25 U/L (ref 17–63)
AST: 23 U/L (ref 15–41)
Albumin: 4.1 g/dL (ref 3.5–5.0)
Anion gap: 6 (ref 5–15)
BUN: 9 mg/dL (ref 6–20)
CALCIUM: 9.4 mg/dL (ref 8.9–10.3)
CO2: 29 mmol/L (ref 22–32)
CREATININE: 0.83 mg/dL (ref 0.61–1.24)
Chloride: 104 mmol/L (ref 101–111)
Glucose, Bld: 100 mg/dL — ABNORMAL HIGH (ref 65–99)
Potassium: 4.3 mmol/L (ref 3.5–5.1)
Sodium: 139 mmol/L (ref 135–145)
Total Bilirubin: 0.6 mg/dL (ref 0.3–1.2)
Total Protein: 7.3 g/dL (ref 6.5–8.1)

## 2018-02-23 LAB — CBC
HEMATOCRIT: 45.1 % (ref 39.0–52.0)
HEMOGLOBIN: 15.4 g/dL (ref 13.0–17.0)
MCH: 32.3 pg (ref 26.0–34.0)
MCHC: 34.1 g/dL (ref 30.0–36.0)
MCV: 94.5 fL (ref 78.0–100.0)
Platelets: 182 10*3/uL (ref 150–400)
RBC: 4.77 MIL/uL (ref 4.22–5.81)
RDW: 12.9 % (ref 11.5–15.5)
WBC: 8.4 10*3/uL (ref 4.0–10.5)

## 2018-02-23 LAB — SURGICAL PCR SCREEN
MRSA, PCR: NEGATIVE
Staphylococcus aureus: POSITIVE — AB

## 2018-02-23 LAB — PROTIME-INR
INR: 0.97
Prothrombin Time: 12.8 seconds (ref 11.4–15.2)

## 2018-02-23 LAB — APTT: aPTT: 31 seconds (ref 24–36)

## 2018-02-23 NOTE — Progress Notes (Signed)
   02/23/18 0829  OBSTRUCTIVE SLEEP APNEA  Have you ever been diagnosed with sleep apnea through a sleep study? No  Do you snore loudly (loud enough to be heard through closed doors)?  1  Do you often feel tired, fatigued, or sleepy during the daytime (such as falling asleep during driving or talking to someone)? 1  Has anyone observed you stop breathing during your sleep? 1  Do you have, or are you being treated for high blood pressure? 1  BMI more than 35 kg/m2? 1  Age > 50 (1-yes) 1  Neck circumference greater than:Male 16 inches or larger, Male 17inches or larger? 1  Male Gender (Yes=1) 1  Obstructive Sleep Apnea Score 8  Score 5 or greater  Results sent to PCP

## 2018-02-28 NOTE — Progress Notes (Signed)
Cardiology Office Note   Date:  03/01/2018   ID:  Jeremy Calderon, DOB 1956/07/16, MRN [id number]  PCP:  Gordan Payment., MD  Cardiologist:   No primary care provider on file.  Chief Complaint  Patient presents with  . Pre-op Exam      History of Present Illness: Jeremy Calderon is a 62 y.o. male who is referred by Gordan Payment., MD for evaluation preop knee replacement.  He has no prior cardiac history.  He did have a stress test years ago apparently for arm pain.  He denies any cardiovascular symptoms.  However, he is very limited functionally.  He takes care of a couple of young children but does not get any significant exercise.  He can go up a few stairs at home.  He is limited by knee pain and back pain.  In the summer he pushes a lawnmower short distance but he has not started doing this yet.  He denies any chest pressure, neck or arm discomfort.  He is not had any palpitations, presyncope or syncope.  He has no PND or orthopnea.  He has had no weight gain or edema.   Past Medical History:  Diagnosis Date  . Arthritis    knees, shoulders, hips, neck.s/p LTKA-'12  . Dyslipidemia   . GERD (gastroesophageal reflux disease)   . H/O seasonal allergies    tx. OTC Loratadine  . Hypertension     Past Surgical History:  Procedure Laterality Date  . CATARACT EXTRACTION, BILATERAL    . HERNIA REPAIR     abdominal hernia repair  . JOINT REPLACEMENT Left    left knee  . KNEE ARTHROSCOPY Left   . KNEE ARTHROSCOPY Right 08/03/2013   Procedure: RIGHT ARTHROSCOPY KNEE WITH DEBRIDEMENT;  Surgeon: Loanne Drilling, MD;  Location: [facility];  Service: Orthopedics;  Laterality: Right;  . TESTICLE SURGERY     remove "lump"  . VASECTOMY       Current Outpatient Medications  Medication Sig Dispense Refill  . amLODipine (NORVASC) 10 MG tablet Take 10 mg by mouth every morning.    . cyclobenzaprine (FLEXERIL) 10 MG tablet Take 10 mg by mouth every evening.    . diphenhydrAMINE (BENADRYL)  25 MG tablet Take 50 mg by mouth daily as needed for allergies.    . fish oil-omega-3 fatty acids 1000 MG capsule Take 1 g by mouth daily.     . fluticasone (FLONASE) 50 MCG/ACT nasal spray Place 2 sprays into both nostrils daily as needed for allergies or rhinitis.    Marland Kitchen gabapentin (NEURONTIN) 100 MG capsule Take 100 mg by mouth at bedtime as needed for mild pain or sleep.  2  . hydrochlorothiazide (HYDRODIURIL) 25 MG tablet Take 25 mg by mouth daily.    Marland Kitchen HYDROcodone-acetaminophen (NORCO) 10-325 MG tablet Take 0.5 tablets by mouth at bedtime as needed for severe pain.   0  . losartan (COZAAR) 50 MG tablet Take 100 mg by mouth daily.    . Magnesium 250 MG TABS Take 250 mg by mouth every evening.    . Menthol, Topical Analgesic, (BIOFREEZE EX) Apply 1 application topically daily as needed (shoulder pain).    . Naphazoline HCl (CLEAR EYES OP) Place 1 drop into both eyes daily as needed (allergies).    . pseudoephedrine-acetaminophen (TYLENOL SINUS) 30-500 MG TABS tablet Take 2 tablets by mouth 2 (two) times daily as needed (sinus congestion).    . rosuvastatin (CRESTOR) 5 MG tablet  Take 5 mg by mouth every evening.     No current facility-administered medications for this visit.     Allergies:   Patient has no known allergies.    Social History:  The patient  reports that he has been smoking cigarettes.  He has a 25.00 pack-year smoking history. He has quit using smokeless tobacco. His smokeless tobacco use included chew. He reports that he does not use drugs.   Family History:  The patient's family history includes CAD (age of onset: 5245) in his father; CAD (age of onset: 6049) in his brother; Heart failure in his mother.    ROS:  Please see the history of present illness.   Otherwise, review of systems are positive for none.   All other systems are reviewed and negative.    PHYSICAL EXAM: VS:  BP (!) 148/89   Pulse 98   Ht 5\' 9"  (1.753 m)   Wt 242 lb 9.6 oz (110 kg)   BMI 35.83 kg/m  ,  BMI Body mass index is 35.83 kg/m. GENERAL:  Well appearing HEENT:  Pupils equal round and reactive, fundi not visualized, oral mucosa unremarkable NECK:  No jugular venous distention, waveform within normal limits, carotid upstroke brisk and symmetric, no bruits, no thyromegaly LYMPHATICS:  No cervical, inguinal adenopathy LUNGS:  Clear to auscultation bilaterally BACK:  No CVA tenderness CHEST:  Unremarkable HEART:  PMI not displaced or sustained,S1 and S2 within normal limits, no S3, no S4, no clicks, no rubs, no murmurs ABD:  Flat, positive bowel sounds normal in frequency in pitch, no bruits, no rebound, no guarding, no midline pulsatile mass, no hepatomegaly, no splenomegaly EXT:  2 plus pulses throughout, no edema, no cyanosis no clubbing SKIN:  No rashes no nodules NEURO:  Cranial nerves II through XII grossly intact, motor grossly intact throughout PSYCH:  Cognitively intact, oriented to person place and time    EKG:  EKG is not ordered today. The ekg ordered 02/23/17 demonstrates normal sinus rhythm, rate 79, leftward axis, intervals within normal limits, no acute ST-T wave changes.     Recent Labs: 02/23/2018: ALT 25; BUN 9; Creatinine, Ser 0.83; Hemoglobin 15.4; Platelets 182; Potassium 4.3; Sodium 139    Lipid Panel No results found for: CHOL, TRIG, HDL, CHOLHDL, VLDL, LDLCALC, LDLDIRECT    Wt Readings from Last 3 Encounters:  03/01/18 242 lb 9.6 oz (110 kg)  02/23/18 248 lb 3.2 oz (112.6 kg)  07/26/13 251 lb 8 oz (114.1 kg)      Other studies Reviewed: Additional studies/ records that were reviewed today include: EKG. Review of the above records demonstrates:  Please see elsewhere in the note.     ASSESSMENT AND PLAN:  PREOP EVALUATION:   The patient has no high risk findings.  He is not going for high risk procedure.  According to modified Nedra HaiLee criteria his risk is low at 0.4% for major cardiovascular events.  Based on this and ACC/AHA criteria no further  preoperative testing is indicated.  The patient would be at acceptable risk for the planned procedure.  TOBACCO ABUSE: We discussed this.  He did not tolerate Chantix in the past.  He is thinking about using the patch.  I suggested he call [phone]  HTN:  The blood pressure is elevated.     Current medicines are reviewed at length with the patient today.  The patient does not have concerns regarding medicines.  The following changes have been made:  no change  Labs/  tests ordered today include: None No orders of the defined types were placed in this encounter.    Disposition:   FU with me as needed.      Signed, Rollene Rotunda, MD  03/01/2018 12:16 PM    [facility]

## 2018-03-01 ENCOUNTER — Ambulatory Visit: Payer: 59 | Admitting: Cardiology

## 2018-03-01 ENCOUNTER — Inpatient Hospital Stay (HOSPITAL_COMMUNITY): Admission: RE | Admit: 2018-03-01 | Payer: 59 | Source: Ambulatory Visit | Admitting: Orthopedic Surgery

## 2018-03-01 ENCOUNTER — Encounter (HOSPITAL_COMMUNITY): Admission: RE | Payer: Self-pay | Source: Ambulatory Visit

## 2018-03-01 ENCOUNTER — Encounter: Payer: Self-pay | Admitting: Cardiology

## 2018-03-01 VITALS — BP 148/89 | HR 98 | Ht 69.0 in | Wt 242.6 lb

## 2018-03-01 DIAGNOSIS — I10 Essential (primary) hypertension: Secondary | ICD-10-CM | POA: Diagnosis not present

## 2018-03-01 DIAGNOSIS — Z01810 Encounter for preprocedural cardiovascular examination: Secondary | ICD-10-CM

## 2018-03-01 DIAGNOSIS — Z720 Tobacco use: Secondary | ICD-10-CM | POA: Diagnosis not present

## 2018-03-01 LAB — TYPE AND SCREEN
ABO/RH(D): A POS
Antibody Screen: NEGATIVE

## 2018-03-01 SURGERY — ARTHROPLASTY, KNEE, TOTAL
Anesthesia: Choice | Site: Knee | Laterality: Right

## 2018-03-01 NOTE — Patient Instructions (Signed)
Medication Instructions:  Continue current medications  If you need a refill on your cardiac medications before your next appointment, please call your pharmacy.  Labwork: None Ordered  Testing/Procedures: None Ordered  Follow-Up: Your physician wants you to follow-up in: As Needed.      Thank you for choosing [facility]!!       

## 2018-03-04 ENCOUNTER — Telehealth: Payer: Self-pay | Admitting: *Deleted

## 2018-03-04 NOTE — Telephone Encounter (Signed)
Dr Antoine PocheHochrein [facility] via [employer]

## 2018-03-11 ENCOUNTER — Ambulatory Visit: Payer: Self-pay | Admitting: Orthopedic Surgery

## 2018-03-18 ENCOUNTER — Ambulatory Visit: Payer: Self-pay | Admitting: Orthopedic Surgery

## 2018-03-18 NOTE — H&P (View-Only) (Signed)
Venning, Trinidad (61yo, M) DOB Jul 12, 1956   Chief Complaint Right Knee Pain H&P right total knee 03-29-2018   Patient's Care Team Primary Care Provider: Gordan PaymentGREG A GRISSO MD: [address], [place], [place] [address], Ph [phone], Fax [phone] NPI: [id number] [facility] [address] [facility]): [address], [place] [id number], Ph [phone], Fax [phone]   Vitals Ht: 5 ft 9 in  Wt: 245 lbs  BMI: 36.2  BP: 140/82  Pulse: 84 bpm    Allergies Reviewed Allergies NSAIDS (NON-STEROIDAL ANTI-INFLAMMATORY DRUG): - cannot take due to hx of ulcers   Medications Reviewed Medications amLODIPine 10 mg tablet 03/17/18   filled [facility] cyclobenzaprine 10 mg tablet TK 1 T PO TID PRN 02/08/18   filled surescripts diphenhydrAMINE 25 mg tablet prn 02/04/18   entered Faith Brown fluticasone propionate 50 mcg/actuation nasal spray,suspension 09/28/17   filled [facility] gabapentin 100 mg capsule prn, start 02/04/2018 02/04/18   filled Faith Brown hydroCHLOROthiazide 25 mg tablet Take 1 tablet(s) every day by oral route. 03/18/18   entered Mattson Dayal, PA-C HYDROcodone 5 mg-acetaminophen 325 mg tablet prn, start 02/04/2018 02/04/18   filled Faith Brown losartan 50 mg tablet 02/17/18   filled [facility] magnesium 02/04/18   entered Faith Brown Mucinex DM prn 02/04/18   entered Kieth BrightlyFaith Brown omeprazole 02/04/18   entered Faith Brown rosuvastatin 5 mg tablet 03/08/18   filled [facility] Sinus prn 02/04/18   entered Kieth BrightlyFaith Brown   Problems Reviewed Problems Degeneration of cervical intervertebral disc - Onset: 12/25/2017 Osteoarthritis of right knee joint - Onset: 12/15/2017 Osteoarthritis of left knee joint - Onset: 12/15/2017 History of left total knee replacement - Onset: 12/15/2017   Family History Reviewed Family History Mother - Arthritis   - Heart disease   - Hypertensive disorder Father - Heart disease   - Hypertensive  disorder Brother - Heart disease   - Hypertensive disorder   Social History Reviewed Social History Smoking Status: Current every day smoker Smoker (1 PPD) Chewing tobacco: none Alcohol intake: None Hand Dominance: Left Work related injury?: N 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 They are scheduled for right total knee replacement on 03-29-2018 with [facility]. The patient's RIGHT knee has progressively gotten worse with time. He has significant dysfunction in that knee for some time now. He has had injections multiple times in the knee and is not as effective as they used to be. Alleviating factors include narcotics which he takes hydrocodone. He is at a stage now of the knee is essentially taken over his life and he like to go ahead and get it fixed. He has had a very successful LEFT total knee done in 2012 and it's doing well.  He has end-stage arthritis of the RIGHT knee with progressively worsening pain and dysfunction. Injections are no longer of benefit. This point the most predictable means of improving pain and function is total knee arthroplasty. We discussed the difference is now compared to when he had the  other one done over a decade ago. We discussed the differences in pain management and rehab. He is encouraged and is ready to go ahead and proceed with a RIGHT total knee. He was scheduled for 03/01/18 but has been rescheduled for  03/29/18 and is now ready to proceed with surgery.    ROS Constitutional: Constitutional: no fever, chills, night sweats, or significant weight loss.  Cardiovascular: Cardiovascular: no palpitations or chest pain.  Respiratory: Respiratory: no shortness of breath, cough, and No COPD.  Gastrointestinal: Gastrointestinal: no vomiting or nausea.  Musculoskeletal: Musculoskeletal: Joint Pain and back pain.  Neurologic: Neurologic: no numbness or difficulty with balance and tingling/parasthesias; cervical parasthesias.   Physical Exam Patient is a 62 year old male.  General Mental Status - Alert, cooperative and good historian. General Appearance - pleasant, Not in acute distress. Orientation - Oriented X3. Build & Nutrition - Well nourished and Well developed.  Head and Neck Head - normocephalic, atraumatic . Neck Global Assessment - supple, no bruit auscultated on the right, no bruit auscultated on the left.  Eye Pupil - Bilateral - PERR Motion - Bilateral - EOMI.  Chest and Lung Exam Auscultation Breath sounds - clear at anterior chest wall and clear at posterior chest wall. Adventitious sounds - No Adventitious sounds.  Cardiovascular Auscultation Rhythm - Regular rate and rhythm. Heart Sounds - S1 WNL and S2 WNL. Murmurs & Other Heart Sounds - Auscultation of the heart reveals - No Murmurs.  Abdomen Palpation/Percussion Tenderness - Abdomen is non-tender to palpation. Abdomen is soft. Auscultation Auscultation of the abdomen reveals - Bowel sounds normal.  Male Genitourinary Note: Not done, not pertinent to present illness  Musculoskeletal His RIGHT knee shows trace effusion. He has a varus deformity. His range of motion is 5-125. He is tender medial greater and lateral with no instability noted. He has an antalgic gait pattern on the RIGHT. Pulse sensation and motor are intact both lower extremities.  Radiographs-AP and lateral of the RIGHT knee show his medial  compartment is completely bone-on-bone with patellofemoral bone on bone also. He has slight varus. His LEFT knee prosthesis is in excellent position with no periprosthetic abnormalities.   Assessment / Plan 1. Osteoarthritis of right knee joint M17.11: Unilateral primary osteoarthritis, right knee PHYSICAL THERAPY REFERRAL -     Schedule Within: provider's discretion    Note to Provider tocall patient to setup first PT EVAL appointment on Friday 04/02/18 - Dr. Lequita Halt   Right Total Knee   Goals Patient Instructions Surgical Plans: Right Total Knee Replacement Disposition: Home, Straight to Outpatient Therapy - [facility] in [place], [place] PCP: Dr. Feliciana Rossetti IV TXA Anesthesia Issues: None Patient was instructed on what medications to stop prior to surgery. - Follow up visit in 2 weeks with Dr. Lequita Halt - Begin physical therapy following surgery - Pre-operative lab work as pre Pre-Surgical Testing - Prescriptions will be provided in hospital at time of discharge  Anticipated LOS equal to or greater than 2 midnights due to - Age 20 and older with one or more of the following:  - Obesity  - Expected need for hospital services (PT, OT, Nursing) required for safe  discharge  - Anticipated need for postoperative skilled nursing care or inpatient rehab  - Active co-morbidities: Elevated Cholesterol, Hypertension  Return to Office Ollen Gross, MD for [facility] on 04/13/2018 at 04:45 PM  Encounter signed-off by Patrica Duel, PA-C

## 2018-03-18 NOTE — H&P (Signed)
Jeremy Calderon, Jeremy Calderon (61yo, M) DOB Jul 12, 1956   Chief Complaint Right Knee Pain H&P right total knee 03-29-2018   Patient's Care Team Primary Care Provider: Gordan PaymentGREG A GRISSO MD: [address], [place], [place] [address], Ph [phone], Fax [phone] NPI: [id number] [facility] [address] [facility]): [address], [place] [id number], Ph [phone], Fax [phone]   Vitals Ht: 5 ft 9 in  Wt: 245 lbs  BMI: 36.2  BP: 140/82  Pulse: 84 bpm    Allergies Reviewed Allergies NSAIDS (NON-STEROIDAL ANTI-INFLAMMATORY DRUG): - cannot take due to hx of ulcers   Medications Reviewed Medications amLODIPine 10 mg tablet 03/17/18   filled [facility] cyclobenzaprine 10 mg tablet TK 1 T PO TID PRN 02/08/18   filled surescripts diphenhydrAMINE 25 mg tablet prn 02/04/18   entered Faith Brown fluticasone propionate 50 mcg/actuation nasal spray,suspension 09/28/17   filled [facility] gabapentin 100 mg capsule prn, start 02/04/2018 02/04/18   filled Faith Brown hydroCHLOROthiazide 25 mg tablet Take 1 tablet(s) every day by oral route. 03/18/18   entered Louis Ivery, PA-C HYDROcodone 5 mg-acetaminophen 325 mg tablet prn, start 02/04/2018 02/04/18   filled Faith Brown losartan 50 mg tablet 02/17/18   filled [facility] magnesium 02/04/18   entered Faith Brown Mucinex DM prn 02/04/18   entered Kieth BrightlyFaith Brown omeprazole 02/04/18   entered Faith Brown rosuvastatin 5 mg tablet 03/08/18   filled [facility] Sinus prn 02/04/18   entered Kieth BrightlyFaith Brown   Problems Reviewed Problems Degeneration of cervical intervertebral disc - Onset: 12/25/2017 Osteoarthritis of right knee joint - Onset: 12/15/2017 Osteoarthritis of left knee joint - Onset: 12/15/2017 History of left total knee replacement - Onset: 12/15/2017   Family History Reviewed Family History Mother - Arthritis   - Heart disease   - Hypertensive disorder Father - Heart disease   - Hypertensive  disorder Brother - Heart disease   - Hypertensive disorder   Social History Reviewed Social History Smoking Status: Current every day smoker Smoker (1 PPD) Chewing tobacco: none Alcohol intake: None Hand Dominance: Left Work related injury?: N 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 They are scheduled for right total knee replacement on 03-29-2018 with [facility]. The patient's RIGHT knee has progressively gotten worse with time. He has significant dysfunction in that knee for some time now. He has had injections multiple times in the knee and is not as effective as they used to be. Alleviating factors include narcotics which he takes hydrocodone. He is at a stage now of the knee is essentially taken over his life and he like to go ahead and get it fixed. He has had a very successful LEFT total knee done in 2012 and it's doing well.  He has end-stage arthritis of the RIGHT knee with progressively worsening pain and dysfunction. Injections are no longer of benefit. This point the most predictable means of improving pain and function is total knee arthroplasty. We discussed the difference is now compared to when he had the  other one done over a decade ago. We discussed the differences in pain management and rehab. He is encouraged and is ready to go ahead and proceed with a RIGHT total knee. He was scheduled for 03/01/18 but has been rescheduled for  03/29/18 and is now ready to proceed with surgery.    ROS Constitutional: Constitutional: no fever, chills, night sweats, or significant weight loss.  Cardiovascular: Cardiovascular: no palpitations or chest pain.  Respiratory: Respiratory: no shortness of breath, cough, and No COPD.  Gastrointestinal: Gastrointestinal: no vomiting or nausea.  Musculoskeletal: Musculoskeletal: Joint Pain and back pain.  Neurologic: Neurologic: no numbness or difficulty with balance and tingling/parasthesias; cervical parasthesias.   Physical Exam Patient is a 62 year old male.  General Mental Status - Alert, cooperative and good historian. General Appearance - pleasant, Not in acute distress. Orientation - Oriented X3. Build & Nutrition - Well nourished and Well developed.  Head and Neck Head - normocephalic, atraumatic . Neck Global Assessment - supple, no bruit auscultated on the right, no bruit auscultated on the left.  Eye Pupil - Bilateral - PERR Motion - Bilateral - EOMI.  Chest and Lung Exam Auscultation Breath sounds - clear at anterior chest wall and clear at posterior chest wall. Adventitious sounds - No Adventitious sounds.  Cardiovascular Auscultation Rhythm - Regular rate and rhythm. Heart Sounds - S1 WNL and S2 WNL. Murmurs & Other Heart Sounds - Auscultation of the heart reveals - No Murmurs.  Abdomen Palpation/Percussion Tenderness - Abdomen is non-tender to palpation. Abdomen is soft. Auscultation Auscultation of the abdomen reveals - Bowel sounds normal.  Male Genitourinary Note: Not done, not pertinent to present illness  Musculoskeletal His RIGHT knee shows trace effusion. He has a varus deformity. His range of motion is 5-125. He is tender medial greater and lateral with no instability noted. He has an antalgic gait pattern on the RIGHT. Pulse sensation and motor are intact both lower extremities.  Radiographs-AP and lateral of the RIGHT knee show his medial  compartment is completely bone-on-bone with patellofemoral bone on bone also. He has slight varus. His LEFT knee prosthesis is in excellent position with no periprosthetic abnormalities.   Assessment / Plan 1. Osteoarthritis of right knee joint M17.11: Unilateral primary osteoarthritis, right knee PHYSICAL THERAPY REFERRAL -     Schedule Within: provider's discretion    Note to Provider tocall patient to setup first PT EVAL appointment on Friday 04/02/18 - Dr. Lequita Halt   Right Total Knee   Goals Patient Instructions Surgical Plans: Right Total Knee Replacement Disposition: Home, Straight to Outpatient Therapy - [facility] in [place], [place] PCP: Dr. Feliciana Rossetti IV TXA Anesthesia Issues: None Patient was instructed on what medications to stop prior to surgery. - Follow up visit in 2 weeks with Dr. Lequita Halt - Begin physical therapy following surgery - Pre-operative lab work as pre Pre-Surgical Testing - Prescriptions will be provided in hospital at time of discharge  Anticipated LOS equal to or greater than 2 midnights due to - Age 20 and older with one or more of the following:  - Obesity  - Expected need for hospital services (PT, OT, Nursing) required for safe  discharge  - Anticipated need for postoperative skilled nursing care or inpatient rehab  - Active co-morbidities: Elevated Cholesterol, Hypertension  Return to Office Ollen Gross, MD for [facility] on 04/13/2018 at 04:45 PM  Encounter signed-off by Patrica Duel, PA-C

## 2018-03-22 ENCOUNTER — Other Ambulatory Visit (HOSPITAL_COMMUNITY): Payer: Self-pay | Admitting: Emergency Medicine

## 2018-03-22 NOTE — Patient Instructions (Signed)
Jeremy Calderon  03/22/2018   Your procedure is scheduled on: 03-29-18   Report to [facility]  Entrance    Report to admitting at 10:00AM    Call this number if you have problems the morning of surgery [phone]     Remember: Do not eat food or drink liquids :After Midnight.     Take these medicines the morning of surgery with A SIP OF WATER: AMLODIPINE, BENADRYL IF NEEDED, HYDROCODONE IF NEEDED,  NASAL SPRAY IF NEEDED                                You may not have any metal on your body including hair pins and              piercings  Do not wear jewelry, make-up, lotions, powders or perfumes, deodorant                   Men may shave face and neck.   Do not bring valuables to the hospital. [facility] IS NOT             RESPONSIBLE   FOR VALUABLES.  Contacts, dentures or bridgework may not be worn into surgery.  Leave suitcase in the car. After surgery it may be brought to your room.                 Please read over the following fact sheets you were given: _____________________________________________________________________  [facility] - Preparing for Surgery Before surgery, you can play an important role.  Because skin is not sterile, your skin needs to be as free of germs as possible.  You can reduce the number of germs on your skin by washing with CHG (chlorahexidine gluconate) soap before surgery.  CHG is an antiseptic cleaner which kills germs and bonds with the skin to continue killing germs even after washing. Please DO NOT use if you have an allergy to CHG or antibacterial soaps.  If your skin becomes reddened/irritated stop using the CHG and inform your nurse when you arrive at Short Stay. Do not shave (including legs and underarms) for at least 48 hours prior to the first CHG shower.  You may shave your face/neck. Please follow these instructions carefully:  1.  Shower with CHG Soap the night before surgery and the  morning of  Surgery.  2.  If you choose to wash your hair, wash your hair first as usual with your  normal  shampoo.  3.  After you shampoo, rinse your hair and body thoroughly to remove the  shampoo.                           4.  Use CHG as you would any other liquid soap.  You can apply chg directly  to the skin and wash                       Gently with a scrungie or clean washcloth.  5.  Apply the CHG Soap to your body ONLY FROM THE NECK DOWN.   Do not use on face/ open                           Wound or  open sores. Avoid contact with eyes, ears mouth and genitals (private parts).                       Wash face,  Genitals (private parts) with your normal soap.             6.  Wash thoroughly, paying special attention to the area where your surgery  will be performed.  7.  Thoroughly rinse your body with warm water from the neck down.  8.  DO NOT shower/wash with your normal soap after using and rinsing off  the CHG Soap.                9.  Pat yourself dry with a clean towel.            10.  Wear clean pajamas.            11.  Place clean sheets on your bed the night of your first shower and do not  sleep with pets. Day of Surgery : Do not apply any lotions/deodorants the morning of surgery.  Please wear clean clothes to the hospital/surgery center.  FAILURE TO FOLLOW THESE INSTRUCTIONS MAY RESULT IN THE CANCELLATION OF YOUR SURGERY PATIENT SIGNATURE_________________________________  NURSE SIGNATURE__________________________________  ________________________________________________________________________              [name]  An incentive spirometer is a tool that can help keep your lungs clear and active. This tool measures how well you are filling your lungs with each breath. Taking long deep breaths may help reverse or decrease the chance of developing breathing (pulmonary) problems (especially infection) following:  A long period of time when you are unable to move or be  active. BEFORE THE PROCEDURE   If the spirometer includes an indicator to show your best effort, your nurse or respiratory therapist will set it to a desired goal.  If possible, sit up straight or lean slightly forward. Try not to slouch.  Hold the incentive spirometer in an upright position. INSTRUCTIONS FOR USE  1. Sit on the edge of your bed if possible, or sit up as far as you can in bed or on a chair. 2. Hold the incentive spirometer in an upright position. 3. Breathe out normally. 4. Place the mouthpiece in your mouth and seal your lips tightly around it. 5. Breathe in slowly and as deeply as possible, raising the piston or the ball toward the top of the column. 6. Hold your breath for 3-5 seconds or for as long as possible. Allow the piston or ball to fall to the bottom of the column. 7. Remove the mouthpiece from your mouth and breathe out normally. 8. Rest for a few seconds and repeat Steps 1 through 7 at least 10 times every 1-2 hours when you are awake. Take your time and take a few normal breaths between deep breaths. 9. The spirometer may include an indicator to show your best effort. Use the indicator as a goal to work toward during each repetition. 10. After each set of 10 deep breaths, practice coughing to be sure your lungs are clear. If you have an incision (the cut made at the time of surgery), support your incision when coughing by placing a pillow or rolled up towels firmly against it. Once you are able to get out of bed, walk around indoors and cough well. You may stop using the incentive spirometer when instructed by your caregiver.  RISKS AND COMPLICATIONS  Take your time so you do not get dizzy or light-headed.  If you are in pain, you may need to take or ask for pain medication before doing incentive spirometry. It is harder to take a deep breath if you are having pain. AFTER USE  Rest and breathe slowly and easily.  It can be helpful to keep track of a log of  your progress. Your caregiver can provide you with a simple table to help with this. If you are using the spirometer at home, follow these instructions: SEEK MEDICAL CARE IF:   You are having difficultly using the spirometer.  You have trouble using the spirometer as often as instructed.  Your pain medication is not giving enough relief while using the spirometer.  You develop fever of 100.5 F (38.1 C) or higher. SEEK IMMEDIATE MEDICAL CARE IF:   You cough up bloody sputum that had not been present before.  You develop fever of 102 F (38.9 C) or greater.  You develop worsening pain at or near the incision site. MAKE SURE YOU:   Understand these instructions.  Will watch your condition.  Will get help right away if you are not doing well or get worse. Document Released: 03/30/2007 Document Revised: 02/09/2012 Document Reviewed: 05/31/2007 Exit[employer] Patient Information 2014 [employer], [place].   ________________________________________________________________________  WHAT IS A BLOOD TRANSFUSION? Blood Transfusion Information  A transfusion is the replacement of blood or some of its parts. Blood is made up of multiple cells which provide different functions.  Red blood cells carry oxygen and are used for blood loss replacement.  White blood cells fight against infection.  Platelets control bleeding.  Plasma helps clot blood.  Other blood products are available for specialized needs, such as hemophilia or other clotting disorders. BEFORE THE TRANSFUSION  Who gives blood for transfusions?   Healthy volunteers who are fully evaluated to make sure their blood is safe. This is blood bank blood. Transfusion therapy is the safest it has ever been in the practice of medicine. Before blood is taken from a donor, a complete history is taken to make sure that person has no history of diseases nor engages in risky social behavior (examples are intravenous drug use or sexual activity  with multiple partners). The donor's travel history is screened to minimize risk of transmitting infections, such as malaria. The donated blood is tested for signs of infectious diseases, such as HIV and hepatitis. The blood is then tested to be sure it is compatible with you in order to minimize the chance of a transfusion reaction. If you or a relative donates blood, this is often done in anticipation of surgery and is not appropriate for emergency situations. It takes many days to process the donated blood. RISKS AND COMPLICATIONS Although transfusion therapy is very safe and saves many lives, the main dangers of transfusion include:   Getting an infectious disease.  Developing a transfusion reaction. This is an allergic reaction to something in the blood you were given. Every precaution is taken to prevent this. The decision to have a blood transfusion has been considered carefully by your caregiver before blood is given. Blood is not given unless the benefits outweigh the risks. AFTER THE TRANSFUSION  Right after receiving a blood transfusion, you will usually feel much better and more energetic. This is especially true if your red blood cells have gotten low (anemic). The transfusion raises the level of the red blood cells which carry oxygen, and this usually causes an energy  increase.  The nurse administering the transfusion will monitor you carefully for complications. HOME CARE INSTRUCTIONS  No special instructions are needed after a transfusion. You may find your energy is better. Speak with your caregiver about any limitations on activity for underlying diseases you may have. SEEK MEDICAL CARE IF:   Your condition is not improving after your transfusion.  You develop redness or irritation at the intravenous (IV) site. SEEK IMMEDIATE MEDICAL CARE IF:  Any of the following symptoms occur over the next 12 hours:  Shaking chills.  You have a temperature by mouth above 102 F (38.9  C), not controlled by medicine.  Chest, back, or muscle pain.  People around you feel you are not acting correctly or are confused.  Shortness of breath or difficulty breathing.  Dizziness and fainting.  You get a rash or develop hives.  You have a decrease in urine output.  Your urine turns a dark color or changes to pink, red, or brown. Any of the following symptoms occur over the next 10 days:  You have a temperature by mouth above 102 F (38.9 C), not controlled by medicine.  Shortness of breath.  Weakness after normal activity.  The white part of the eye turns yellow (jaundice).  You have a decrease in the amount of urine or are urinating less often.  Your urine turns a dark color or changes to pink, red, or brown. Document Released: 11/14/2000 Document Revised: 02/09/2012 Document Reviewed: 07/03/2008 [facility] Patient Information 2014 [place], [place].  _______________________________________________________________________

## 2018-03-22 NOTE — Progress Notes (Addendum)
LOV.CARDIOLOGY CLEARANCE, DR.JAMES HOCHREIN 03-01-18 on chart   EKG 02-23-18 Epic

## 2018-03-23 ENCOUNTER — Encounter (HOSPITAL_COMMUNITY)
Admission: RE | Admit: 2018-03-23 | Discharge: 2018-03-23 | Disposition: A | Discharge status: Home / Self Care | Payer: 59 | Source: Ambulatory Visit | Attending: Orthopedic Surgery | Admitting: Orthopedic Surgery

## 2018-03-23 ENCOUNTER — Other Ambulatory Visit: Payer: Self-pay

## 2018-03-23 ENCOUNTER — Encounter (HOSPITAL_COMMUNITY): Payer: Self-pay

## 2018-03-23 DIAGNOSIS — Z01812 Encounter for preprocedural laboratory examination: Secondary | ICD-10-CM | POA: Insufficient documentation

## 2018-03-23 LAB — COMPREHENSIVE METABOLIC PANEL
ALBUMIN: 4.1 g/dL (ref 3.5–5.0)
ALK PHOS: 70 U/L (ref 38–126)
ALT: 29 U/L (ref 17–63)
ANION GAP: 9 (ref 5–15)
AST: 22 U/L (ref 15–41)
BUN: 9 mg/dL (ref 6–20)
CALCIUM: 9.7 mg/dL (ref 8.9–10.3)
CHLORIDE: 105 mmol/L (ref 101–111)
CO2: 27 mmol/L (ref 22–32)
Creatinine, Ser: 0.9 mg/dL (ref 0.61–1.24)
GFR calc non Af Amer: >60 mL/min (ref >60)
GLUCOSE: 106 mg/dL — AB (ref 65–99)
Potassium: 4.5 mmol/L (ref 3.5–5.1)
Sodium: 141 mmol/L (ref 135–145)
Total Bilirubin: 0.4 mg/dL (ref 0.3–1.2)
Total Protein: 7.4 g/dL (ref 6.5–8.1)

## 2018-03-23 LAB — CBC
HCT: 45.7 % (ref 39.0–52.0)
HEMOGLOBIN: 15.8 g/dL (ref 13.0–17.0)
MCH: 32.5 pg (ref 26.0–34.0)
MCHC: 34.6 g/dL (ref 30.0–36.0)
MCV: 94 fL (ref 78.0–100.0)
PLATELETS: 181 10*3/uL (ref 150–400)
RBC: 4.86 MIL/uL (ref 4.22–5.81)
RDW: 12.9 % (ref 11.5–15.5)
WBC: 7.1 10*3/uL (ref 4.0–10.5)

## 2018-03-23 LAB — APTT: APTT: 31 s (ref 24–36)

## 2018-03-23 LAB — SURGICAL PCR SCREEN
MRSA, PCR: NEGATIVE
Staphylococcus aureus: NEGATIVE

## 2018-03-23 LAB — PROTIME-INR
INR: 0.98
Prothrombin Time: 12.9 seconds (ref 11.4–15.2)

## 2018-03-28 MED ORDER — BUPIVACAINE LIPOSOME 1.3 % IJ SUSP
20.0000 mL | Freq: Once | INTRAMUSCULAR | Status: DC
  Filled 2018-03-28: qty 20

## 2018-03-29 ENCOUNTER — Inpatient Hospital Stay (HOSPITAL_COMMUNITY): Payer: 59 | Admitting: Anesthesiology

## 2018-03-29 ENCOUNTER — Encounter (HOSPITAL_COMMUNITY): Payer: Self-pay | Admitting: *Deleted

## 2018-03-29 ENCOUNTER — Encounter (HOSPITAL_COMMUNITY)
Admission: RE | Disposition: A | Discharge status: Home / Self Care | Payer: Self-pay | Source: Ambulatory Visit | Attending: Orthopedic Surgery

## 2018-03-29 ENCOUNTER — Other Ambulatory Visit: Payer: Self-pay

## 2018-03-29 ENCOUNTER — Inpatient Hospital Stay (HOSPITAL_COMMUNITY)
Admission: RE | Admit: 2018-03-29 | Discharge: 2018-03-31 | DRG: 470 | Disposition: A | Discharge status: Home / Self Care | Payer: 59 | Source: Ambulatory Visit | Attending: Orthopedic Surgery | Admitting: Orthopedic Surgery

## 2018-03-29 DIAGNOSIS — M160 Bilateral primary osteoarthritis of hip: Secondary | ICD-10-CM | POA: Diagnosis present

## 2018-03-29 DIAGNOSIS — I10 Essential (primary) hypertension: Secondary | ICD-10-CM | POA: Diagnosis present

## 2018-03-29 DIAGNOSIS — Z8249 Family history of ischemic heart disease and other diseases of the circulatory system: Secondary | ICD-10-CM

## 2018-03-29 DIAGNOSIS — H547 Unspecified visual loss: Secondary | ICD-10-CM | POA: Diagnosis present

## 2018-03-29 DIAGNOSIS — M19012 Primary osteoarthritis, left shoulder: Secondary | ICD-10-CM | POA: Diagnosis present

## 2018-03-29 DIAGNOSIS — Z96652 Presence of left artificial knee joint: Secondary | ICD-10-CM | POA: Diagnosis present

## 2018-03-29 DIAGNOSIS — E7800 Pure hypercholesterolemia, unspecified: Secondary | ICD-10-CM | POA: Diagnosis present

## 2018-03-29 DIAGNOSIS — Z888 Allergy status to other drugs, medicaments and biological substances status: Secondary | ICD-10-CM | POA: Diagnosis not present

## 2018-03-29 DIAGNOSIS — E785 Hyperlipidemia, unspecified: Secondary | ICD-10-CM | POA: Diagnosis present

## 2018-03-29 DIAGNOSIS — K219 Gastro-esophageal reflux disease without esophagitis: Secondary | ICD-10-CM | POA: Diagnosis present

## 2018-03-29 DIAGNOSIS — M179 Osteoarthritis of knee, unspecified: Secondary | ICD-10-CM | POA: Diagnosis present

## 2018-03-29 DIAGNOSIS — Z8261 Family history of arthritis: Secondary | ICD-10-CM

## 2018-03-29 DIAGNOSIS — M19011 Primary osteoarthritis, right shoulder: Secondary | ICD-10-CM | POA: Diagnosis present

## 2018-03-29 DIAGNOSIS — M1711 Unilateral primary osteoarthritis, right knee: Principal | ICD-10-CM | POA: Diagnosis present

## 2018-03-29 DIAGNOSIS — M5030 Other cervical disc degeneration, unspecified cervical region: Secondary | ICD-10-CM | POA: Diagnosis present

## 2018-03-29 DIAGNOSIS — F17210 Nicotine dependence, cigarettes, uncomplicated: Secondary | ICD-10-CM | POA: Diagnosis present

## 2018-03-29 DIAGNOSIS — M1710 Unilateral primary osteoarthritis, unspecified knee: Secondary | ICD-10-CM

## 2018-03-29 HISTORY — PX: TOTAL KNEE ARTHROPLASTY: SHX125

## 2018-03-29 LAB — TYPE AND SCREEN
ABO/RH(D): A POS
ANTIBODY SCREEN: NEGATIVE

## 2018-03-29 SURGERY — ARTHROPLASTY, KNEE, TOTAL
Anesthesia: Spinal | Site: Knee | Laterality: Right

## 2018-03-29 MED ORDER — MENTHOL 3 MG MT LOZG: 1.0000 | LOZENGE | OROMUCOSAL | Status: DC | PRN

## 2018-03-29 MED ORDER — DIPHENHYDRAMINE HCL 12.5 MG/5ML PO ELIX: 12.5000 mg | ORAL_SOLUTION | ORAL | Status: DC | PRN

## 2018-03-29 MED ORDER — DEXAMETHASONE SODIUM PHOSPHATE 10 MG/ML IJ SOLN
10.0000 mg | Freq: Once | INTRAMUSCULAR | Status: AC
  Administered 2018-03-30: 10 mg via INTRAVENOUS
  Filled 2018-03-29: qty 1

## 2018-03-29 MED ORDER — PROMETHAZINE HCL 25 MG/ML IJ SOLN: 6.2500 mg | INTRAMUSCULAR | Status: DC | PRN

## 2018-03-29 MED ORDER — PHENYLEPHRINE 40 MCG/ML (10ML) SYRINGE FOR IV PUSH (FOR BLOOD PRESSURE SUPPORT)
PREFILLED_SYRINGE | INTRAVENOUS | Status: DC | PRN
  Administered 2018-03-29: 100 ug via INTRAVENOUS

## 2018-03-29 MED ORDER — RIVAROXABAN 10 MG PO TABS
10.0000 mg | ORAL_TABLET | Freq: Every day | ORAL | Status: DC
  Administered 2018-03-30 – 2018-03-31 (×2): 10 mg via ORAL
  Filled 2018-03-29 (×2): qty 1

## 2018-03-29 MED ORDER — FLUTICASONE PROPIONATE 50 MCG/ACT NA SUSP
2.0000 | Freq: Every day | NASAL | Status: DC | PRN
  Filled 2018-03-29: qty 16

## 2018-03-29 MED ORDER — METHOCARBAMOL 500 MG PO TABS
500.0000 mg | ORAL_TABLET | Freq: Four times a day (QID) | ORAL | Status: DC | PRN
  Filled 2018-03-29: qty 1

## 2018-03-29 MED ORDER — PHENYLEPHRINE 40 MCG/ML (10ML) SYRINGE FOR IV PUSH (FOR BLOOD PRESSURE SUPPORT)
PREFILLED_SYRINGE | INTRAVENOUS | Status: AC
  Filled 2018-03-29: qty 10

## 2018-03-29 MED ORDER — AMLODIPINE BESYLATE 10 MG PO TABS
10.0000 mg | ORAL_TABLET | Freq: Every morning | ORAL | Status: DC
  Administered 2018-03-30 – 2018-03-31 (×2): 10 mg via ORAL
  Filled 2018-03-29 (×2): qty 1

## 2018-03-29 MED ORDER — ONDANSETRON HCL 4 MG/2ML IJ SOLN: 4.0000 mg | Freq: Four times a day (QID) | INTRAMUSCULAR | Status: DC | PRN

## 2018-03-29 MED ORDER — PROPOFOL 10 MG/ML IV BOLUS
INTRAVENOUS | Status: AC
Start: 2018-03-29 — End: 2018-03-29
  Filled 2018-03-29: qty 40

## 2018-03-29 MED ORDER — ONDANSETRON HCL 4 MG PO TABS: 4.0000 mg | ORAL_TABLET | Freq: Four times a day (QID) | ORAL | Status: DC | PRN

## 2018-03-29 MED ORDER — ACETAMINOPHEN 500 MG PO TABS
1000.0000 mg | ORAL_TABLET | Freq: Four times a day (QID) | ORAL | Status: AC
  Administered 2018-03-29 – 2018-03-30 (×3): 1000 mg via ORAL
  Filled 2018-03-29 (×3): qty 2

## 2018-03-29 MED ORDER — 0.9 % SODIUM CHLORIDE (POUR BTL) OPTIME
TOPICAL | Status: DC | PRN
  Administered 2018-03-29: 1000 mL

## 2018-03-29 MED ORDER — MIDAZOLAM HCL 2 MG/2ML IJ SOLN
2.0000 mg | Freq: Once | INTRAMUSCULAR | Status: AC
  Administered 2018-03-29: 2 mg via INTRAVENOUS
  Filled 2018-03-29: qty 2

## 2018-03-29 MED ORDER — PHENOL 1.4 % MT LIQD
1.0000 | OROMUCOSAL | Status: DC | PRN
  Filled 2018-03-29: qty 177

## 2018-03-29 MED ORDER — GABAPENTIN 100 MG PO CAPS: 100.0000 mg | ORAL_CAPSULE | Freq: Every evening | ORAL | Status: DC | PRN

## 2018-03-29 MED ORDER — PROPOFOL 500 MG/50ML IV EMUL
INTRAVENOUS | Status: DC | PRN
  Administered 2018-03-29: 100 ug/kg/min via INTRAVENOUS

## 2018-03-29 MED ORDER — POLYETHYLENE GLYCOL 3350 17 G PO PACK: 17.0000 g | PACK | Freq: Every day | ORAL | Status: DC | PRN

## 2018-03-29 MED ORDER — GABAPENTIN 300 MG PO CAPS
300.0000 mg | ORAL_CAPSULE | Freq: Once | ORAL | Status: AC
  Administered 2018-03-29: 300 mg via ORAL
  Filled 2018-03-29: qty 1

## 2018-03-29 MED ORDER — CYCLOBENZAPRINE HCL 10 MG PO TABS
10.0000 mg | ORAL_TABLET | Freq: Every evening | ORAL | Status: DC
  Administered 2018-03-29 – 2018-03-30 (×2): 10 mg via ORAL
  Filled 2018-03-29 (×2): qty 1

## 2018-03-29 MED ORDER — RIVAROXABAN 10 MG PO TABS: 10.0000 mg | ORAL_TABLET | Freq: Every day | ORAL | 0 refills | Status: AC

## 2018-03-29 MED ORDER — CEFAZOLIN SODIUM-DEXTROSE 2-4 GM/100ML-% IV SOLN
2.0000 g | INTRAVENOUS | Status: AC
  Administered 2018-03-29: 2 g via INTRAVENOUS
  Filled 2018-03-29: qty 100

## 2018-03-29 MED ORDER — SODIUM CHLORIDE 0.9 % IJ SOLN
INTRAMUSCULAR | Status: DC | PRN
  Administered 2018-03-29: 60 mL

## 2018-03-29 MED ORDER — LACTATED RINGERS IV SOLN
INTRAVENOUS | Status: DC
  Administered 2018-03-29 (×2): via INTRAVENOUS

## 2018-03-29 MED ORDER — SODIUM CHLORIDE 0.9 % IR SOLN
Status: DC | PRN
  Administered 2018-03-29: 1000 mL

## 2018-03-29 MED ORDER — METOCLOPRAMIDE HCL 5 MG PO TABS: 5.0000 mg | ORAL_TABLET | Freq: Three times a day (TID) | ORAL | Status: DC | PRN

## 2018-03-29 MED ORDER — TRAMADOL HCL 50 MG PO TABS: 50.0000 mg | ORAL_TABLET | Freq: Four times a day (QID) | ORAL | Status: DC | PRN

## 2018-03-29 MED ORDER — FLEET ENEMA 7-19 GM/118ML RE ENEM: 1.0000 | ENEMA | Freq: Once | RECTAL | Status: DC | PRN

## 2018-03-29 MED ORDER — CHLORHEXIDINE GLUCONATE 4 % EX LIQD: 60.0000 mL | Freq: Once | CUTANEOUS | Status: DC

## 2018-03-29 MED ORDER — DEXAMETHASONE SODIUM PHOSPHATE 10 MG/ML IJ SOLN
10.0000 mg | Freq: Once | INTRAMUSCULAR | Status: AC
  Administered 2018-03-29: 10 mg via INTRAVENOUS

## 2018-03-29 MED ORDER — STERILE WATER FOR IRRIGATION IR SOLN
Status: DC | PRN
  Administered 2018-03-29: 2000 mL

## 2018-03-29 MED ORDER — OXYCODONE HCL 5 MG PO TABS
10.0000 mg | ORAL_TABLET | ORAL | Status: DC | PRN
  Administered 2018-03-30: 10 mg via ORAL
  Filled 2018-03-29 (×2): qty 2

## 2018-03-29 MED ORDER — SODIUM CHLORIDE 0.9 % IJ SOLN
INTRAMUSCULAR | Status: AC
  Filled 2018-03-29: qty 10

## 2018-03-29 MED ORDER — BUPIVACAINE LIPOSOME 1.3 % IJ SUSP
INTRAMUSCULAR | Status: DC | PRN
  Administered 2018-03-29: 20 mL

## 2018-03-29 MED ORDER — ACETAMINOPHEN 10 MG/ML IV SOLN
1000.0000 mg | Freq: Once | INTRAVENOUS | Status: AC
  Administered 2018-03-29: 1000 mg via INTRAVENOUS
  Filled 2018-03-29: qty 100

## 2018-03-29 MED ORDER — PROPOFOL 10 MG/ML IV BOLUS
INTRAVENOUS | Status: DC | PRN
  Administered 2018-03-29: 30 mg via INTRAVENOUS

## 2018-03-29 MED ORDER — BISACODYL 10 MG RE SUPP: 10.0000 mg | Freq: Every day | RECTAL | Status: DC | PRN

## 2018-03-29 MED ORDER — OXYCODONE HCL 5 MG PO TABS
5.0000 mg | ORAL_TABLET | ORAL | Status: DC | PRN
  Administered 2018-03-29 – 2018-03-31 (×7): 10 mg via ORAL
  Filled 2018-03-29 (×6): qty 2

## 2018-03-29 MED ORDER — ROSUVASTATIN CALCIUM 5 MG PO TABS
5.0000 mg | ORAL_TABLET | Freq: Every evening | ORAL | Status: DC
  Administered 2018-03-29 – 2018-03-30 (×2): 5 mg via ORAL
  Filled 2018-03-29 (×2): qty 1

## 2018-03-29 MED ORDER — FENTANYL CITRATE (PF) 100 MCG/2ML IJ SOLN
100.0000 ug | Freq: Once | INTRAMUSCULAR | Status: AC
  Administered 2018-03-29: 50 ug via INTRAVENOUS
  Filled 2018-03-29: qty 2

## 2018-03-29 MED ORDER — BUPIVACAINE IN DEXTROSE 0.75-8.25 % IT SOLN
INTRATHECAL | Status: DC | PRN
  Administered 2018-03-29: 1.8 mL via INTRATHECAL

## 2018-03-29 MED ORDER — LOSARTAN POTASSIUM 50 MG PO TABS
100.0000 mg | ORAL_TABLET | Freq: Every day | ORAL | Status: DC
  Administered 2018-03-30 – 2018-03-31 (×2): 100 mg via ORAL
  Filled 2018-03-29 (×2): qty 2

## 2018-03-29 MED ORDER — ONDANSETRON HCL 4 MG/2ML IJ SOLN
INTRAMUSCULAR | Status: AC
  Filled 2018-03-29: qty 2

## 2018-03-29 MED ORDER — HYDROMORPHONE HCL 1 MG/ML IJ SOLN: 0.2500 mg | INTRAMUSCULAR | Status: DC | PRN

## 2018-03-29 MED ORDER — TRANEXAMIC ACID 1000 MG/10ML IV SOLN
2000.0000 mg | Freq: Once | INTRAVENOUS | Status: AC
  Administered 2018-03-29: 2000 mg via TOPICAL
  Filled 2018-03-29: qty 20

## 2018-03-29 MED ORDER — ROPIVACAINE HCL 7.5 MG/ML IJ SOLN
INTRAMUSCULAR | Status: DC | PRN
  Administered 2018-03-29: 20 mL via PERINEURAL

## 2018-03-29 MED ORDER — CEFAZOLIN SODIUM-DEXTROSE 2-4 GM/100ML-% IV SOLN
2.0000 g | Freq: Four times a day (QID) | INTRAVENOUS | Status: AC
  Administered 2018-03-29 – 2018-03-30 (×2): 2 g via INTRAVENOUS
  Filled 2018-03-29 (×2): qty 100

## 2018-03-29 MED ORDER — HYDROCHLOROTHIAZIDE 25 MG PO TABS
25.0000 mg | ORAL_TABLET | Freq: Every day | ORAL | Status: DC
  Administered 2018-03-30 – 2018-03-31 (×2): 25 mg via ORAL
  Filled 2018-03-29 (×2): qty 1

## 2018-03-29 MED ORDER — LACTATED RINGERS IV SOLN: INTRAVENOUS | Status: DC

## 2018-03-29 MED ORDER — SODIUM CHLORIDE 0.9 % IV SOLN
INTRAVENOUS | Status: DC
  Administered 2018-03-29: 19:00:00 via INTRAVENOUS

## 2018-03-29 MED ORDER — DOCUSATE SODIUM 100 MG PO CAPS
100.0000 mg | ORAL_CAPSULE | Freq: Two times a day (BID) | ORAL | Status: DC
  Administered 2018-03-29 – 2018-03-31 (×4): 100 mg via ORAL
  Filled 2018-03-29 (×4): qty 1

## 2018-03-29 MED ORDER — SODIUM CHLORIDE 0.9 % IJ SOLN
INTRAMUSCULAR | Status: AC
  Filled 2018-03-29: qty 50

## 2018-03-29 MED ORDER — PROPOFOL 10 MG/ML IV BOLUS
INTRAVENOUS | Status: AC
  Filled 2018-03-29: qty 60

## 2018-03-29 MED ORDER — HYDROMORPHONE HCL 1 MG/ML IJ SOLN: 0.5000 mg | INTRAMUSCULAR | Status: DC | PRN

## 2018-03-29 MED ORDER — DEXAMETHASONE SODIUM PHOSPHATE 10 MG/ML IJ SOLN
INTRAMUSCULAR | Status: AC
  Filled 2018-03-29: qty 1

## 2018-03-29 MED ORDER — METHOCARBAMOL 500 MG PO TABS: 500.0000 mg | ORAL_TABLET | Freq: Four times a day (QID) | ORAL | 0 refills | Status: AC | PRN

## 2018-03-29 MED ORDER — METHOCARBAMOL 1000 MG/10ML IJ SOLN
500.0000 mg | Freq: Four times a day (QID) | INTRAVENOUS | Status: DC | PRN
  Filled 2018-03-29: qty 5

## 2018-03-29 MED ORDER — METOCLOPRAMIDE HCL 5 MG/ML IJ SOLN: 5.0000 mg | Freq: Three times a day (TID) | INTRAMUSCULAR | Status: DC | PRN

## 2018-03-29 SURGICAL SUPPLY — 52 items
BAG DECANTER FOR FLEXI CONT (MISCELLANEOUS) ×3 IMPLANT
BAG ZIPLOCK 12X15 (MISCELLANEOUS) ×3 IMPLANT
BANDAGE ACE 6X5 VEL STRL LF (GAUZE/BANDAGES/DRESSINGS) ×3 IMPLANT
BLADE SAG 18X100X1.27 (BLADE) ×3 IMPLANT
BLADE SAW SGTL 11.0X1.19X90.0M (BLADE) ×3 IMPLANT
BNDG CONFORM 6X.82 1P STRL (GAUZE/BANDAGES/DRESSINGS) ×3 IMPLANT
BOWL SMART MIX CTS (DISPOSABLE) ×3 IMPLANT
CAPT KNEE TOTAL 3 ATTUNE ×3 IMPLANT
CEMENT HV SMART SET (Cement) ×6 IMPLANT
CLOSURE WOUND 1/2 X4 (GAUZE/BANDAGES/DRESSINGS) ×2
COVER SURGICAL LIGHT HANDLE (MISCELLANEOUS) ×3 IMPLANT
CUFF TOURN SGL QUICK 34 (TOURNIQUET CUFF) ×2
CUFF TRNQT CYL 34X4X40X1 (TOURNIQUET CUFF) ×1 IMPLANT
DECANTER SPIKE VIAL GLASS SM (MISCELLANEOUS) ×3 IMPLANT
DRAPE TOP 10253 STERILE (DRAPES) IMPLANT
DRAPE U-SHAPE 47X51 STRL (DRAPES) ×3 IMPLANT
DRSG ADAPTIC 3X8 NADH LF (GAUZE/BANDAGES/DRESSINGS) ×3 IMPLANT
DRSG PAD ABDOMINAL 8X10 ST (GAUZE/BANDAGES/DRESSINGS) ×3 IMPLANT
DURAPREP 26ML APPLICATOR (WOUND CARE) ×3 IMPLANT
ELECT REM PT RETURN 15FT ADLT (MISCELLANEOUS) ×3 IMPLANT
EVACUATOR 1/8 PVC DRAIN (DRAIN) ×3 IMPLANT
GAUZE SPONGE 4X4 12PLY STRL (GAUZE/BANDAGES/DRESSINGS) ×3 IMPLANT
GLOVE BIO SURGEON STRL SZ7.5 (GLOVE) IMPLANT
GLOVE BIO SURGEON STRL SZ8 (GLOVE) ×3 IMPLANT
GLOVE BIOGEL PI IND STRL 6.5 (GLOVE) IMPLANT
GLOVE BIOGEL PI IND STRL 8 (GLOVE) ×1 IMPLANT
GLOVE BIOGEL PI INDICATOR 6.5 (GLOVE)
GLOVE BIOGEL PI INDICATOR 8 (GLOVE) ×2
GLOVE SURG SS PI 6.5 STRL IVOR (GLOVE) IMPLANT
GOWN STRL REUS W/TWL LRG LVL3 (GOWN DISPOSABLE) ×3 IMPLANT
GOWN STRL REUS W/TWL XL LVL3 (GOWN DISPOSABLE) IMPLANT
HANDPIECE INTERPULSE COAX TIP (DISPOSABLE) ×2
IMMOBILIZER KNEE 20 (SOFTGOODS) ×3
IMMOBILIZER KNEE 20 THIGH 36 (SOFTGOODS) ×1 IMPLANT
MANIFOLD NEPTUNE II (INSTRUMENTS) ×3 IMPLANT
NS IRRIG 1000ML POUR BTL (IV SOLUTION) ×3 IMPLANT
PACK TOTAL KNEE CUSTOM (KITS) ×3 IMPLANT
PADDING CAST COTTON 6X4 STRL (CAST SUPPLIES) ×9 IMPLANT
POSITIONER SURGICAL ARM (MISCELLANEOUS) ×3 IMPLANT
SET HNDPC FAN SPRY TIP SCT (DISPOSABLE) ×1 IMPLANT
STRIP CLOSURE SKIN 1/2X4 (GAUZE/BANDAGES/DRESSINGS) ×4 IMPLANT
SUT MNCRL AB 4-0 PS2 18 (SUTURE) ×3 IMPLANT
SUT STRATAFIX 0 PDS 27 VIOLET (SUTURE) ×3
SUT VIC AB 2-0 CT1 27 (SUTURE) ×6
SUT VIC AB 2-0 CT1 TAPERPNT 27 (SUTURE) ×3 IMPLANT
SUTURE STRATFX 0 PDS 27 VIOLET (SUTURE) ×1 IMPLANT
SYR 50ML LL SCALE MARK (SYRINGE) ×3 IMPLANT
TAPE STRIPS DRAPE STRL (GAUZE/BANDAGES/DRESSINGS) ×3 IMPLANT
TRAY FOLEY W/METER SILVER 16FR (SET/KITS/TRAYS/PACK) ×3 IMPLANT
WATER STERILE IRR 1000ML POUR (IV SOLUTION) ×6 IMPLANT
WRAP KNEE MAXI GEL POST OP (GAUZE/BANDAGES/DRESSINGS) ×3 IMPLANT
YANKAUER SUCT BULB TIP 10FT TU (MISCELLANEOUS) ×3 IMPLANT

## 2018-03-29 NOTE — Transfer of Care (Signed)
Immediate Anesthesia Transfer of Care Note  Patient: Jeremy Calderon  Procedure(s) Performed: Procedure(s): RIGHT TOTAL KNEE ARTHROPLASTY (Right)  Patient Location: PACU  Anesthesia Type:Spinal  Level of Consciousness:  sedated, patient cooperative and responds to stimulation  Airway & Oxygen Therapy:Patient Spontanous Breathing and Patient connected to face mask oxgen  Post-op Assessment:  Report given to PACU RN and Post -op Vital signs reviewed and stable  Post vital signs:  Reviewed and stable  Last Vitals:  Vitals:   03/29/18 1232 03/29/18 1233  BP:    Pulse: 73 76  Resp:    Temp:    SpO2: 95% 96%    Complications: No apparent anesthesia complications

## 2018-03-29 NOTE — Anesthesia Preprocedure Evaluation (Signed)
Anesthesia Evaluation  Patient identified by MRN, date of birth, ID band Patient awake    Reviewed: Allergy & Precautions, NPO status , Patient's Chart, lab work & pertinent test results  Airway Mallampati: II  TM Distance: >3 FB Neck ROM: Full    Dental no notable dental hx.    Pulmonary Current Smoker,    Pulmonary exam normal breath sounds clear to auscultation       Cardiovascular hypertension, Normal cardiovascular exam Rhythm:Regular Rate:Normal     Neuro/Psych negative neurological ROS  negative psych ROS   GI/Hepatic negative GI ROS, Neg liver ROS,   Endo/Other  negative endocrine ROS  Renal/GU negative Renal ROS  negative genitourinary   Musculoskeletal negative musculoskeletal ROS (+)   Abdominal   Peds negative pediatric ROS (+)  Hematology negative hematology ROS (+)   Anesthesia Other Findings   Reproductive/Obstetrics negative OB ROS                             Anesthesia Physical Anesthesia Plan  ASA: II  Anesthesia Plan: Spinal   Post-op Pain Management:    Induction: Intravenous  PONV Risk Score and Plan: 1 and Ondansetron, Dexamethasone and Treatment may vary due to age or medical condition  Airway Management Planned: Simple Face Mask  Additional Equipment:   Intra-op Plan:   Post-operative Plan:   Informed Consent: I have reviewed the patients History and Physical, chart, labs and discussed the procedure including the risks, benefits and alternatives for the proposed anesthesia with the patient or authorized representative who has indicated his/her understanding and acceptance.   Dental advisory given  Plan Discussed with: CRNA and Surgeon  Anesthesia Plan Comments:         Anesthesia Quick Evaluation

## 2018-03-29 NOTE — Anesthesia Procedure Notes (Signed)
Anesthesia Procedure Image    

## 2018-03-29 NOTE — Anesthesia Procedure Notes (Signed)
Spinal  Patient location during procedure: OR Start time: 03/29/2018 12:55 PM End time: 03/29/2018 12:59 PM Reason for block: at surgeon's request Staffing Resident/CRNA: Anne Fu, CRNA Performed: resident/CRNA  Preanesthetic Checklist Completed: patient identified, site marked, surgical consent, pre-op evaluation, timeout performed, IV checked, risks and benefits discussed and monitors and equipment checked Spinal Block Patient position: sitting Prep: DuraPrep Patient monitoring: heart rate, continuous pulse ox and blood pressure Approach: right paramedian Location: L2-3 Injection technique: single-shot Needle Needle type: Pencan  Needle gauge: 24 G Needle length: 9 cm Assessment Sensory level: T6 Additional Notes Expiration date of kit checked and confirmed. Patient tolerated procedure well, without complications. X 1 attempt with noted clear CSF return. Loss of motor and sensory on exam post injection.

## 2018-03-29 NOTE — Anesthesia Postprocedure Evaluation (Signed)
Anesthesia Post Note  Patient: [name]  Procedure(s) Performed: RIGHT TOTAL KNEE ARTHROPLASTY (Right Knee)     Patient location during evaluation: PACU Anesthesia Type: Spinal Level of consciousness: oriented and awake and alert Pain management: pain level controlled Vital Signs Assessment: post-procedure vital signs reviewed and stable Respiratory status: spontaneous breathing, respiratory function stable and patient connected to nasal cannula oxygen Cardiovascular status: blood pressure returned to baseline and stable Postop Assessment: no headache, no backache and no apparent nausea or vomiting Anesthetic complications: no    Last Jeremy Calderon:  Jeremy Calderon:   03/29/18 1430 03/29/18 1445  BP: 112/79 110/78  Pulse: 89 78  Resp: (!) 22 19  Temp: 36.6 C   SpO2: 98% 98%    Last Pain:  Jeremy Calderon:   03/29/18 1445  TempSrc:   PainSc: Asleep                 Lakiah Dhingra S

## 2018-03-29 NOTE — Op Note (Signed)
OPERATIVE REPORT-TOTAL KNEE ARTHROPLASTY   Pre-operative diagnosis- Osteoarthritis  Right knee(s)  Post-operative diagnosis- Osteoarthritis Right knee(s)  Procedure-  Right  Total Knee Arthroplasty  Surgeon- Jeremy Calderon. Jeremy Jimerson, MD  Assistant- Jeremy Half, PA-C   Anesthesia-  Adductor canal block and spinal  EBL-100 mL   Drains Hemovac  Tourniquet time-  Total Tourniquet Time Documented: Thigh (Right) - 38 minutes Total: Thigh (Right) - 38 minutes     Complications- None  Condition-PACU - hemodynamically stable.   Brief Clinical Note  Jeremy Calderon is a 62 y.o. year old male with end stage OA of his right knee with progressively worsening pain and dysfunction. He has constant pain, with activity and at rest and significant functional deficits with difficulties even with ADLs. He has had extensive non-op management including analgesics, injections of cortisone and viscosupplements, and home exercise program, but remains in significant pain with significant dysfunction. Radiographs show bone on bone arthritis medial and patellofemoral. He presents now for right Total Knee Arthroplasty.    Procedure in detail---   The patient is brought into the operating room and positioned supine on the operating table. After successful administration of  Adductor canal block and spinal,   a tourniquet is placed high on the  Right thigh(s) and the lower extremity is prepped and draped in the usual sterile fashion. Time out is performed by the operating team and then the  Right lower extremity is wrapped in Esmarch, knee flexed and the tourniquet inflated to 300 mmHg.       A midline incision is made with a ten blade through the subcutaneous tissue to the level of the extensor mechanism. A fresh blade is used to make a medial parapatellar arthrotomy. Soft tissue over the proximal medial tibia is subperiosteally elevated to the joint line with a knife and into the semimembranosus bursa with a Cobb  elevator. Soft tissue over the proximal lateral tibia is elevated with attention being paid to avoiding the patellar tendon on the tibial tubercle. The patella is everted, knee flexed 90 degrees and the ACL and PCL are removed. Findings are bone on bone medial and patellofemoral with large global osteophytes.        The drill is used to create a starting hole in the distal femur and the canal is thoroughly irrigated with sterile saline to remove the fatty contents. The 5 degree Right  valgus alignment guide is placed into the femoral canal and the distal femoral cutting block is pinned to remove 9 mm off the distal femur. Resection is made with an oscillating saw.      The tibia is subluxed forward and the menisci are removed. The extramedullary alignment guide is placed referencing proximally at the medial aspect of the tibial tubercle and distally along the second metatarsal axis and tibial crest. The block is pinned to remove 2mm off the more deficient medial  side. Resection is made with an oscillating saw. Size 7is the most appropriate size for the tibia and the proximal tibia is prepared with the modular drill and keel punch for that size.      The femoral sizing guide is placed and size 7 is most appropriate. Rotation is marked off the epicondylar axis and confirmed by creating a rectangular flexion gap at 90 degrees. The size 7 cutting block is pinned in this rotation and the anterior, posterior and chamfer cuts are made with the oscillating saw. The intercondylar block is then placed and that cut is made.  Trial size 7 tibial component, trial size 7 posterior stabilized femur and a 10  mm posterior stabilized rotating platform insert trial is placed. Full extension is achieved with excellent varus/valgus and anterior/posterior balance throughout full range of motion. The patella is everted and thickness measured to be 27  mm. Free hand resection is taken to 15 mm, a 41 template is placed, lug holes  are drilled, trial patella is placed, and it tracks normally. Osteophytes are removed off the posterior femur with the trial in place. All trials are removed and the cut bone surfaces prepared with pulsatile lavage. Cement is mixed and once ready for implantation, the size 7 tibial implant, size  7 posterior stabilized femoral component, and the size 41 patella are cemented in place and the patella is held with the clamp. The trial insert is placed and the knee held in full extension. The Exparel (20 ml mixed with 60 ml saline) is injected into the extensor mechanism, posterior capsule, medial and lateral gutters and subcutaneous tissues.  All extruded cement is removed and once the cement is hard the permanent 10 mm posterior stabilized rotating platform insert is placed into the tibial tray.      The wound is copiously irrigated with saline solution and the extensor mechanism closed over a hemovac drain with #1 V-loc suture. The tourniquet is released for a total tourniquet time of 38  minutes. Flexion against gravity is 140 degrees and the patella tracks normally. Subcutaneous tissue is closed with 2.0 vicryl and subcuticular with running 4.0 Monocryl. The incision is cleaned and dried and steri-strips and a bulky sterile dressing are applied. The limb is placed into a knee immobilizer and the patient is awakened and transported to recovery in stable condition.      Please note that a surgical assistant was a medical necessity for this procedure in order to perform it in a safe and expeditious manner. Surgical assistant was necessary to retract the ligaments and vital neurovascular structures to prevent injury to them and also necessary for proper positioning of the limb to allow for anatomic placement of the prosthesis.   Jeremy Calderon Jeremy Kulesza, MD    03/29/2018, 2:19 PM

## 2018-03-29 NOTE — Discharge Instructions (Addendum)
° °Dr. Frank Aluisio °Total Joint Specialist °[facility] °[address] °[place], [place] [address] °[phone] ° °TOTAL KNEE REPLACEMENT POSTOPERATIVE DIRECTIONS ° °Knee Rehabilitation, Guidelines Following Surgery  °Results after knee surgery are often greatly improved when you follow the exercise, range of motion and muscle strengthening exercises prescribed by your doctor. Safety measures are also important to protect the knee from further injury. Any time any of these exercises cause you to have increased pain or swelling in your knee joint, decrease the amount until you are comfortable again and slowly increase them. If you have problems or questions, call your caregiver or physical therapist for advice.  ° °HOME CARE INSTRUCTIONS  °Remove items at home which could result in a fall. This includes throw rugs or furniture in walking pathways.  °· ICE to the affected knee every three hours for 30 minutes at a time and then as needed for pain and swelling.  Continue to use ice on the knee for pain and swelling from surgery. You may notice swelling that will progress down to the foot and ankle.  This is normal after surgery.  Elevate the leg when you are not up walking on it.   °· Continue to use the breathing machine which will help keep your temperature down.  It is common for your temperature to cycle up and down following surgery, especially at night when you are not up moving around and exerting yourself.  The breathing machine keeps your lungs expanded and your temperature down. °· Do not place pillow under knee, focus on keeping the knee straight while resting ° °DIET °You may resume your previous home diet once your are discharged from the hospital. ° °DRESSING / WOUND CARE / SHOWERING °You may change your dressing every day with sterile gauze.  Please use good hand washing techniques before changing the dressing.  Do not use any lotions or creams on the incision until instructed by your  surgeon. °You may start showering once you are discharged home but do not submerge the incision under water. Just pat the incision dry and apply a dry gauze dressing on daily. °Change the surgical dressing daily and reapply a dry dressing each time. ° °ACTIVITY °Walk with your walker as instructed. °Use walker as long as suggested by your caregivers. °Avoid periods of inactivity such as sitting longer than an hour when not asleep. This helps prevent blood clots.  °You may resume a sexual relationship in one month or when given the OK by your doctor.  °You may return to work once you are cleared by your doctor.  °Do not drive a car for 6 weeks or until released by you surgeon.  °Do not drive while taking narcotics. ° °WEIGHT BEARING °Weight bearing as tolerated with assist device (walker, cane, etc) as directed, use it as long as suggested by your surgeon or therapist, typically at least 4-6 weeks. ° °POSTOPERATIVE CONSTIPATION PROTOCOL °Constipation - defined medically as fewer than three stools per week and severe constipation as less than one stool per week. ° °One of the most common issues patients have following surgery is constipation.  Even if you have a regular bowel pattern at home, your normal regimen is likely to be disrupted due to multiple reasons following surgery.  Combination of anesthesia, postoperative narcotics, change in appetite and fluid intake all can affect your bowels.  In order to avoid complications following surgery, here are some recommendations in order to help you during your recovery period. ° °  Colace (docusate) - Pick up an over-the-counter form of Colace or another stool softener and take twice a day as long as you are requiring postoperative pain medications.  Take with a full glass of water daily.  If you experience loose stools or diarrhea, hold the colace until you stool forms back up.  If your symptoms do not get better within 1 week or if they get worse, check with your  doctor.  Dulcolax (bisacodyl) - Pick up over-the-counter and take as directed by the product packaging as needed to assist with the movement of your bowels.  Take with a full glass of water.  Use this product as needed if not relieved by Colace only.   MiraLax (polyethylene glycol) - Pick up over-the-counter to have on hand.  MiraLax is a solution that will increase the amount of water in your bowels to assist with bowel movements.  Take as directed and can mix with a glass of water, juice, soda, coffee, or tea.  Take if you go more than two days without a movement. Do not use MiraLax more than once per day. Call your doctor if you are still constipated or irregular after using this medication for 7 days in a row.  If you continue to have problems with postoperative constipation, please contact the office for further assistance and recommendations.  If you experience "the worst abdominal pain ever" or develop nausea or vomiting, please contact the office immediatly for further recommendations for treatment.  ITCHING  If you experience itching with your medications, try taking only a single pain pill, or even half a pain pill at a time.  You can also use Benadryl over the counter for itching or also to help with sleep.   TED HOSE STOCKINGS Wear the elastic stockings on both legs for three weeks following surgery during the day but you may remove then at night for sleeping.  MEDICATIONS See your medication summary on the After Visit Summary that the nursing staff will review with you prior to discharge.  You may have some home medications which will be placed on hold until you complete the course of blood thinner medication.  It is important for you to complete the blood thinner medication as prescribed by your surgeon.  Continue your approved medications as instructed at time of discharge.  Take Xarelto for two and a half more weeks following discharge from the hospital, then discontinue  Xarelto. Once the patient has completed the blood thinner regimen, then take a Baby 81 mg Aspirin daily for three more weeks.  PRECAUTIONS If you experience chest pain or shortness of breath - call 911 immediately for transfer to the hospital emergency department.  If you develop a fever greater that 101 F, purulent drainage from wound, increased redness or drainage from wound, foul odor from the wound/dressing, or calf pain - CONTACT YOUR SURGEON.                                                   FOLLOW-UP APPOINTMENTS Make sure you keep all of your appointments after your operation with your surgeon and caregivers. You should call the office at the above phone number and make an appointment for approximately two weeks after the date of your surgery or on the date instructed by your surgeon outlined in the "After Visit Summary".  RANGE OF MOTION AND STRENGTHENING EXERCISES  Rehabilitation of the knee is important following a knee injury or an operation. After just a few days of immobilization, the muscles of the thigh which control the knee become weakened and shrink (atrophy). Knee exercises are designed to build up the tone and strength of the thigh muscles and to improve knee motion. Often times heat used for twenty to thirty minutes before working out will loosen up your tissues and help with improving the range of motion but do not use heat for the first two weeks following surgery. These exercises can be done on a training (exercise) mat, on the floor, on a table or on a bed. Use what ever works the best and is most comfortable for you Knee exercises include:  Leg Lifts - While your knee is still immobilized in a splint or cast, you can do straight leg raises. Lift the leg to 60 degrees, hold for 3 sec, and slowly lower the leg. Repeat 10-20 times 2-3 times daily. Perform this exercise against resistance later as your knee gets better.  Quad and Hamstring Sets - Tighten up the muscle on the  front of the thigh (Quad) and hold for 5-10 sec. Repeat this 10-20 times hourly. Hamstring sets are done by pushing the foot backward against an object and holding for 5-10 sec. Repeat as with quad sets.   Leg Slides: Lying on your back, slowly slide your foot toward your buttocks, bending your knee up off the floor (only go as far as is comfortable). Then slowly slide your foot back down until your leg is flat on the floor again.  Angel Wings: Lying on your back spread your legs to the side as far apart as you can without causing discomfort.  A rehabilitation program following serious knee injuries can speed recovery and prevent re-injury in the future due to weakened muscles. Contact your doctor or a physical therapist for more information on knee rehabilitation.   IF YOU ARE TRANSFERRED TO A SKILLED REHAB FACILITY If the patient is transferred to a skilled rehab facility following release from the hospital, a list of the current medications will be sent to the facility for the patient to continue.  When discharged from the skilled rehab facility, please have the facility set up the patient's Home Health Physical Therapy prior to being released. Also, the skilled facility will be responsible for providing the patient with their medications at time of release from the facility to include their pain medication, the muscle relaxants, and their blood thinner medication. If the patient is still at the rehab facility at time of the two week follow up appointment, the skilled rehab facility will also need to assist the patient in arranging follow up appointment in our office and any transportation needs.  MAKE SURE YOU:  Understand these instructions.  Get help right away if you are not doing well or get worse.    Pick up stool softner and laxative for home use following surgery while on pain medications. Do not submerge incision under water. Please use good hand washing techniques while changing  dressing each day. May shower starting three days after surgery. Please use a clean towel to pat the incision dry following showers. Continue to use ice for pain and swelling after surgery. Do not use any lotions or creams on the incision until instructed by your surgeon.  Information on my medicine - XARELTO (Rivaroxaban)  This medication education was reviewed with me or my healthcare  representative as part of my discharge preparation.  The pharmacist that spoke with me during my hospital stay was:  Rollene Fare, RPH  Why was Xarelto prescribed for you? Xarelto was prescribed for you to reduce the risk of blood clots forming after orthopedic surgery. The medical term for these abnormal blood clots is venous thromboembolism (VTE).  What do you need to know about xarelto ? Take your Xarelto ONCE DAILY at the same time every day. You may take it either with or without food.  If you have difficulty swallowing the tablet whole, you may crush it and mix in applesauce just prior to taking your dose.  Take Xarelto exactly as prescribed by your doctor and DO NOT stop taking Xarelto without talking to the doctor who prescribed the medication.  Stopping without other VTE prevention medication to take the place of Xarelto may increase your risk of developing a clot.  After discharge, you should have regular check-up appointments with your healthcare provider that is prescribing your Xarelto.    What do you do if you miss a dose? If you miss a dose, take it as soon as you remember on the same day then continue your regularly scheduled once daily regimen the next day. Do not take two doses of Xarelto on the same day.   Important Safety Information A possible side effect of Xarelto is bleeding. You should call your healthcare provider right away if you experience any of the following: ? Bleeding from an injury or your nose that does not stop. ? Unusual colored urine (red or dark  brown) or unusual colored stools (red or black). ? Unusual bruising for unknown reasons. ? A serious fall or if you hit your head (even if there is no bleeding).  Some medicines may interact with Xarelto and might increase your risk of bleeding while on Xarelto. To help avoid this, consult your healthcare provider or pharmacist prior to using any new prescription or non-prescription medications, including herbals, vitamins, non-steroidal anti-inflammatory drugs (NSAIDs) and supplements.  This website has more information on Xarelto: VisitDestination.com.br.

## 2018-03-29 NOTE — Progress Notes (Signed)
Assisted Dr. Rose with right, ultrasound guided, adductor canal block. Side rails up, monitors on throughout procedure. See vital signs in flow sheet. Tolerated Procedure well.  

## 2018-03-29 NOTE — Anesthesia Procedure Notes (Signed)
Anesthesia Regional Block: Adductor canal block   Pre-Anesthetic Checklist: ,, timeout performed, Correct Patient, Correct Site, Correct Laterality, Correct Procedure, Correct Position, site marked, Risks and benefits discussed,  Surgical consent,  Pre-op evaluation,  At surgeon's request and post-op pain management  Laterality: Right  Prep: chloraprep       Needles:  Injection technique: Single-shot  Needle Type: Echogenic Needle     Needle Length: 9cm      Additional Needles:   Procedures:,,,, ultrasound used (permanent image in chart),,,,  Narrative:  Start time: 03/29/2018 12:05 PM End time: 03/29/2018 12:12 PM Injection made incrementally with aspirations every 5 mL.  Performed by: Personally  Anesthesiologist: Eilene Ghazi, MD  Additional Notes: Patient tolerated the procedure well without complications

## 2018-03-29 NOTE — Interval H&P Note (Signed)
History and Physical Interval Note:  03/29/2018 10:36 AM  Jeremy Calderon  has presented today for surgery, with the diagnosis of Osteoarthritis Right Knee  The various methods of treatment have been discussed with the patient and family. After consideration of risks, benefits and other options for treatment, the patient has consented to  Procedure(s): RIGHT TOTAL KNEE ARTHROPLASTY (Right) as a surgical intervention .  The patient's history has been reviewed, patient examined, no change in status, stable for surgery.  I have reviewed the patient's chart and labs.  Questions were answered to the patient's satisfaction.     Homero Fellers Kitzia Camus

## 2018-03-29 NOTE — Plan of Care (Signed)
Reviewed plan of care with pt and spouse, specifically pain control and safety measures. Pt attentive and verbalized understanding.

## 2018-03-30 LAB — CBC
HEMATOCRIT: 39.9 % (ref 39.0–52.0)
HEMOGLOBIN: 13.5 g/dL (ref 13.0–17.0)
MCH: 31.8 pg (ref 26.0–34.0)
MCHC: 33.8 g/dL (ref 30.0–36.0)
MCV: 93.9 fL (ref 78.0–100.0)
Platelets: 174 10*3/uL (ref 150–400)
RBC: 4.25 MIL/uL (ref 4.22–5.81)
RDW: 12.8 % (ref 11.5–15.5)
WBC: 15.8 10*3/uL — ABNORMAL HIGH (ref 4.0–10.5)

## 2018-03-30 LAB — BASIC METABOLIC PANEL
Anion gap: 8 (ref 5–15)
BUN: 12 mg/dL (ref 6–20)
CHLORIDE: 104 mmol/L (ref 101–111)
CO2: 25 mmol/L (ref 22–32)
CREATININE: 0.74 mg/dL (ref 0.61–1.24)
Calcium: 8.8 mg/dL — ABNORMAL LOW (ref 8.9–10.3)
GFR calc Af Amer: >60 mL/min (ref >60)
GFR calc non Af Amer: >60 mL/min (ref >60)
Glucose, Bld: 124 mg/dL — ABNORMAL HIGH (ref 65–99)
Potassium: 4.5 mmol/L (ref 3.5–5.1)
SODIUM: 137 mmol/L (ref 135–145)

## 2018-03-30 NOTE — Progress Notes (Signed)
   Subjective: 1 Day Post-Op Procedure(s) (LRB): RIGHT TOTAL KNEE ARTHROPLASTY (Right) Patient reports pain as mild.   Patient seen in rounds with Dr. Lequita Halt. Patient is well, and has had no acute complaints or problems. No issues overnight. No SOB or chest pain.  Plan is to go Home after hospital stay.  Objective: Vital signs in last 24 hours: Temp:  [97.8 F (36.6 C)-98.2 F (36.8 C)] 97.9 F (36.6 C) (04/30 0541) Pulse Rate:  [66-89] 77 (04/30 0541) Resp:  [12-22] 17 (04/30 0541) BP: (109-148)/(73-96) 133/74 (04/30 0541) SpO2:  [94 %-98 %] 96 % (04/30 0541) Weight:  [111.6 kg (246 lb)] 111.6 kg (246 lb) (04/29 1040)  Intake/Output from previous day:  Intake/Output Summary (Last 24 hours) at 03/30/2018 0650 Last data filed at 03/30/2018 0600 Gross per 24 hour  Intake 4168.33 ml  Output 2445 ml  Net 1723.33 ml    Intake/Output this shift: Total I/O In: 2128.3 [P.O.:580; I.V.:1098.3; Other:250; IV Piggyback:200] Out: 580 [Urine:400; Drains:180]   EXAM General - Patient is Alert and Oriented Extremity - Neurologically intact Intact pulses distally Dorsiflexion/Plantar flexion intact Compartment soft Dressing - dressing C/D/I Motor Function - intact, moving foot and toes well on exam.  Hemovac pulled without difficulty.  Past Medical History:  Diagnosis Date  . Arthritis    knees, shoulders, hips, neck.s/p LTKA-'12  . Dyslipidemia   . GERD (gastroesophageal reflux disease)   . H/O seasonal allergies    tx. OTC Loratadine  . Hypertension     Assessment/Plan: 1 Day Post-Op Procedure(s) (LRB): RIGHT TOTAL KNEE ARTHROPLASTY (Right) Principal Problem:   OA (osteoarthritis) of knee  Estimated body mass index is 36.33 kg/m as calculated from the following:   Height as of this encounter:  (1.753 m).   Weight as of this encounter: 111.6 kg (246 lb). Advance diet Up with therapy D/C IV fluids when tolerating POs well  DVT Prophylaxis -  Xarelto Weight-Bearing as tolerated  D/C O2 and Pulse OX and try on Room Air  Plan to work with therapy today. DC home tomorrow to PT at [facility] if meeting goals.   Dimitri Ped, PA-C Orthopaedic Surgery 03/30/2018, 6:50 AM

## 2018-03-30 NOTE — Addendum Note (Signed)
Addendum  created 03/30/18 0707 by Paris Lore, CRNA   Charge Capture section accepted

## 2018-03-30 NOTE — Evaluation (Signed)
Physical Therapy Evaluation Patient Details Name: Jeremy Calderon MRN: [id number] DOB: 15-Sep-1956 Today's Date: 03/30/2018   History of Present Illness  62 yo male s/p R TKA 03/19/18  Clinical Impression  On eval, pt required Min assist for mobility. He walked ~75 feet with a RW. Pain rated 7/10 with activity. Will progress activity as tolerated. Plan is for d/c home on tomorrow with OP PT f/u.     Follow Up Recommendations Follow surgeon's recommendation for DC plan and follow-up therapies    Equipment Recommendations  Rolling walker with 5" wheels    Recommendations for Other Services       Precautions / Restrictions Precautions Precautions: Fall Required Braces or Orthoses: Knee Immobilizer - Right Knee Immobilizer - Right: Discontinue once straight leg raise with < 10 degree lag Restrictions Weight Bearing Restrictions: No Other Position/Activity Restrictions: WBAT      Mobility  Bed Mobility Overal bed mobility: Needs Assistance Bed Mobility: Supine to Sit     Supine to sit: Min assist;HOB elevated     General bed mobility comments: Assist for R LE.   Transfers Overall transfer level: Needs assistance Equipment used: Rolling walker (2 wheeled) Transfers: Sit to/from Stand Sit to Stand: Min guard;From elevated surface         General transfer comment: close guard for safety. VCs safety, hand/LE placement.   Ambulation/Gait Ambulation/Gait assistance: Min guard Ambulation Distance (Feet): 75 Feet Assistive device: Rolling walker (2 wheeled) Gait Pattern/deviations: Step-to pattern;Antalgic     General Gait Details: VCs safety, sequence. Slow gait speed.   Stairs            Wheelchair Mobility    Modified Rankin (Stroke Patients Only)       Balance                                             Pertinent Vitals/Pain Pain Assessment: 0-10 Pain Score: 7  Pain Location: R knee Pain Descriptors / Indicators:  Sore;Aching Pain Intervention(s): Monitored during session;Repositioned    Home Living Family/patient expects to be discharged to:: Private residence Living Arrangements: Spouse/significant other Available Help at Discharge: Family Type of Home: Mobile home Home Access: Stairs to enter Entrance Stairs-Rails: Right Entrance Stairs-Number of Steps: 4 Home Layout: One level Home Equipment: None      Prior Function Level of Independence: Independent               Hand Dominance        Extremity/Trunk Assessment   Upper Extremity Assessment Upper Extremity Assessment: Overall WFL for tasks assessed    Lower Extremity Assessment Lower Extremity Assessment: Generalized weakness(s/p R TKA)    Cervical / Trunk Assessment Cervical / Trunk Assessment: Normal  Communication   Communication: No difficulties  Cognition Arousal/Alertness: Awake/alert Behavior During Therapy: WFL for tasks assessed/performed Overall Cognitive Status: Within Functional Limits for tasks assessed                                        General Comments      Exercises Total Joint Exercises Ankle Circles/Pumps: AROM;Both;10 reps;Supine Quad Sets: AROM;Both;10 reps;Supine Heel Slides: AAROM;Right;10 reps;Supine Hip ABduction/ADduction: AROM;Right;10 reps;Supine Straight Leg Raises: AROM;AAROM;Right;10 reps;Supine Goniometric ROM: ~10-55 degrees   Assessment/Plan    PT Assessment Patient needs continued  PT services  PT Problem List Decreased strength;Decreased range of motion;Decreased balance;Decreased mobility;Decreased knowledge of use of DME;Pain;Decreased activity tolerance       PT Treatment Interventions DME instruction;Gait training;Functional mobility training;Therapeutic activities;Balance training;Patient/family education;Therapeutic exercise    PT Goals (Current goals can be found in the Care Plan section)  Acute Rehab PT Goals Patient Stated Goal: regain  PLOF/independence PT Goal Formulation: With patient Time For Goal Achievement: 04/13/18 Potential to Achieve Goals: Good    Frequency 7X/week   Barriers to discharge        Co-evaluation               AM-PAC PT "6 Clicks" Daily Activity  Outcome Measure Difficulty turning over in bed (including adjusting bedclothes, sheets and blankets)?: A Little Difficulty moving from lying on back to sitting on the side of the bed? : Unable Difficulty sitting down on and standing up from a chair with arms (e.g., wheelchair, bedside commode, etc,.)?: A Little Help needed moving to and from a bed to chair (including a wheelchair)?: A Little Help needed walking in hospital room?: A Little Help needed climbing 3-5 steps with a railing? : A Little 6 Click Score: 16    End of Session Equipment Utilized During Treatment: Gait belt;Right knee immobilizer Activity Tolerance: Patient tolerated treatment well Patient left: in chair;with call bell/phone within reach;with family/visitor present   PT Visit Diagnosis: Pain;Difficulty in walking, not elsewhere classified (R26.2) Pain - Right/Left: Right Pain - part of body: Knee    Time: 1050-1110 PT Time Calculation (min) (ACUTE ONLY): 20 min   Charges:   PT Evaluation $PT Eval Low Complexity: 1 Low     PT G Codes:         Rebeca Alert, MPT Pager: [phone]

## 2018-03-30 NOTE — Progress Notes (Signed)
Physical Therapy Treatment Patient Details Name: Jeremy SCHERTZERwer MRN: [id number] DOB: [birth year] Today's Date: 03/30/2018    History of Present Illness 62 yo male s/p R TKA 03/19/18    PT Comments    Progressing well with mobility.    Follow Up Recommendations  Follow surgeon's recommendation for DC plan and follow-up therapies     Equipment Recommendations  Rolling walker with 5" wheels    Recommendations for Other Services       Precautions / Restrictions Precautions Precautions: Fall Required Braces or Orthoses: Knee Immobilizer - Right Knee Immobilizer - Right: Discontinue once straight leg raise with < 10 degree lag Restrictions Weight Bearing Restrictions: No Other Position/Activity Restrictions: WBAT    Mobility  Bed Mobility Overal bed mobility: Needs Assistance Bed Mobility: Sit to Supine     Supine to sit: Min assist;HOB elevated Sit to supine: Min guard   General bed mobility comments: close guard for safety. Left bed elevated to simulate home situation  Transfers Overall transfer level: Needs assistance Equipment used: Rolling walker (2 wheeled) Transfers: Sit to/from Stand Sit to Stand: Min guard         General transfer comment: close guard for safety. VCs safety, hand/LE placement.   Ambulation/Gait Ambulation/Gait assistance: Min guard Ambulation Distance (Feet): 115 Feet Assistive device: Rolling walker (2 wheeled) Gait Pattern/deviations: Step-to pattern;Antalgic     General Gait Details: VCs safety, sequence. close guard for safety.   Stairs             Wheelchair Mobility    Modified Rankin (Stroke Patients Only)       Balance                                            Cognition Arousal/Alertness: Awake/alert Behavior During Therapy: WFL for tasks assessed/performed Overall Cognitive Status: Within Functional Limits for tasks assessed                                         Exercises Total Joint Exercises Ankle Circles/Pumps: AROM;Both;10 reps;Supine Quad Sets: AROM;Both;10 reps;Supine Heel Slides: AAROM;Right;10 reps;Supine Hip ABduction/ADduction: AROM;Right;10 reps;Supine Straight Leg Raises: AROM;AAROM;Right;10 reps;Supine Goniometric ROM: ~10-55 degrees    General Comments        Pertinent Vitals/Pain Pain Assessment: 0-10 Pain Score: 6  Pain Location: R knee Pain Descriptors / Indicators: Sore;Aching Pain Intervention(s): Monitored during session;Repositioned    Home Living Family/patient expects to be discharged to:: Private residence Living Arrangements: Spouse/significant other Available Help at Discharge: Family Type of Home: Mobile home Home Access: Stairs to enter Entrance Stairs-Rails: Right Home Layout: One level Home Equipment: None      Prior Function Level of Independence: Independent          PT Goals (current goals can now be found in the care plan section) Acute Rehab PT Goals Patient Stated Goal: regain PLOF/independence PT Goal Formulation: With patient Time For Goal Achievement: 04/13/18 Potential to Achieve Goals: Good Progress towards PT goals: Progressing toward goals    Frequency    7X/week      PT Plan Current plan remains appropriate    Co-evaluation              AM-PAC PT "6 Clicks" Daily Activity  Outcome Measure  Difficulty turning over in  bed (including adjusting bedclothes, sheets and blankets)?: A Little Difficulty moving from lying on back to sitting on the side of the bed? : A Little Difficulty sitting down on and standing up from a chair with arms (e.g., wheelchair, bedside commode, etc,.)?: A Little Help needed moving to and from a bed to chair (including a wheelchair)?: A Little Help needed walking in hospital room?: A Little Help needed climbing 3-5 steps with a railing? : A Little 6 Click Score: 18    End of Session Equipment Utilized During Treatment: Gait belt;Right  knee immobilizer Activity Tolerance: Patient tolerated treatment well Patient left: in bed;with call bell/phone within reach;with family/visitor present   PT Visit Diagnosis: Pain;Difficulty in walking, not elsewhere classified (R26.2) Pain - Right/Left: Right Pain - part of body: Knee     Time: 1610-9604 PT Time Calculation (min) (ACUTE ONLY): 14 min  Charges:  $Gait Training: 8-22 mins                    G Codes:         Rebeca Alert, MPT Pager: [phone]

## 2018-03-30 NOTE — Progress Notes (Signed)
Spoke with patient and spouse at bedside. Confirmed plan for OP PT, already arranged. Needs a RW, contacted [facility] to deliver to room. [phone] 

## 2018-03-31 LAB — BASIC METABOLIC PANEL
Anion gap: 9 (ref 5–15)
BUN: 14 mg/dL (ref 6–20)
CALCIUM: 9.1 mg/dL (ref 8.9–10.3)
CO2: 27 mmol/L (ref 22–32)
CREATININE: 0.71 mg/dL (ref 0.61–1.24)
Chloride: 105 mmol/L (ref 101–111)
Glucose, Bld: 108 mg/dL — ABNORMAL HIGH (ref 65–99)
Potassium: 3.6 mmol/L (ref 3.5–5.1)
SODIUM: 141 mmol/L (ref 135–145)

## 2018-03-31 LAB — CBC
HCT: 38.2 % — ABNORMAL LOW (ref 39.0–52.0)
Hemoglobin: 12.8 g/dL — ABNORMAL LOW (ref 13.0–17.0)
MCH: 31.4 pg (ref 26.0–34.0)
MCHC: 33.5 g/dL (ref 30.0–36.0)
MCV: 93.6 fL (ref 78.0–100.0)
Platelets: 178 10*3/uL (ref 150–400)
RBC: 4.08 MIL/uL — ABNORMAL LOW (ref 4.22–5.81)
RDW: 12.9 % (ref 11.5–15.5)
WBC: 16.4 10*3/uL — ABNORMAL HIGH (ref 4.0–10.5)

## 2018-03-31 MED ORDER — TRAMADOL HCL 50 MG PO TABS: 50.0000 mg | ORAL_TABLET | Freq: Four times a day (QID) | ORAL | 0 refills | Status: AC | PRN

## 2018-03-31 MED ORDER — OXYCODONE HCL 5 MG PO TABS: 5.0000 mg | ORAL_TABLET | ORAL | 0 refills | Status: AC | PRN

## 2018-03-31 NOTE — Progress Notes (Signed)
   Subjective: 2 Days Post-Op Procedure(s) (LRB): RIGHT TOTAL KNEE ARTHROPLASTY (Right) Patient reports pain as moderate.   Patient seen in rounds with by Aluisio. Patient is well, and has had no acute complaints or problems Plan is to go Home after hospital stay.  Objective: Vital signs in last 24 hours: Temp:  [98 F (36.7 C)-98.7 F (37.1 C)] 98.7 F (37.1 C) (05/01 0528) Pulse Rate:  [82-90] 84 (05/01 0528) Resp:  [14-20] 17 (05/01 0528) BP: (129-140)/(74-81) 131/76 (05/01 0528) SpO2:  [92 %-95 %] 93 % (05/01 0528)  Intake/Output from previous day:  Intake/Output Summary (Last 24 hours) at 03/31/2018 0737 Last data filed at 03/31/2018 0600 Gross per 24 hour  Intake 2416.67 ml  Output 3815 ml  Net -1398.33 ml    Intake/Output this shift: No intake/output data recorded.  Labs: Recent Labs    03/30/18 0621 03/31/18 0554  HGB 13.5 12.8*   Recent Labs    03/30/18 0621 03/31/18 0554  WBC 15.8* 16.4*  RBC 4.25 4.08*  HCT 39.9 38.2*  PLT 174 178   Recent Labs    03/30/18 0621 03/31/18 0554  NA 137 141  K 4.5 3.6  CL 104 105  CO2 25 27  BUN 12 14  CREATININE 0.74 0.71  GLUCOSE 124* 108*  CALCIUM 8.8* 9.1   No results for input(s): LABPT, INR in the last 72 hours.  EXAM General - Patient is Alert, Appropriate and Oriented Extremity - Neurovascular intact Sensation intact distally Intact pulses distally Dorsiflexion/Plantar flexion intact Dressing/Incision - clean, dry, no drainage Motor Function - intact, moving foot and toes well on exam.   Past Medical History:  Diagnosis Date  . Arthritis    knees, shoulders, hips, neck.s/p LTKA-'12  . Dyslipidemia   . GERD (gastroesophageal reflux disease)   . H/O seasonal allergies    tx. OTC Loratadine  . Hypertension     Assessment/Plan: 2 Days Post-Op Procedure(s) (LRB): RIGHT TOTAL KNEE ARTHROPLASTY (Right) Principal Problem:   OA (osteoarthritis) of knee  Estimated body mass index is 36.33 kg/m  as calculated from the following:   Height as of this encounter:  (1.753 m).   Weight as of this encounter: 111.6 kg (246 lb). Up with therapy   DVT Prophylaxis - Xarelto Weight-Bearing as tolerated. Discharge home after therapy with outpatient PT.  Dimitri Ped, PA-C Orthopaedic Surgery 03/31/2018, 7:37 AM

## 2018-03-31 NOTE — Progress Notes (Signed)
Physical Therapy Treatment Patient Details Name: Jeremy Calderon MRN: [id number] DOB: 03-24-1956 Today's Date: 03/31/2018    History of Present Illness 62 yo male s/p R TKA 03/19/18    PT Comments    Reviewed/practiced gait training and stair negotiation training. Issued HEP for pt to perform 2x/day until he begins OP PT. All education completed. Okay to d/c from PT standpoint-made RN aware.    Follow Up Recommendations  Follow surgeon's recommendation for DC plan and follow-up therapies     Equipment Recommendations  Rolling walker with 5" wheels    Recommendations for Other Services       Precautions / Restrictions Precautions Precautions: Fall Required Braces or Orthoses: Knee Immobilizer - Right Knee Immobilizer - Right: Discontinue once straight leg raise with < 10 degree lag(did not use KI this session) Restrictions Weight Bearing Restrictions: No Other Position/Activity Restrictions: WBAT    Mobility  Bed Mobility                  Transfers Overall transfer level: Needs assistance Equipment used: Rolling walker (2 wheeled) Transfers: Sit to/from Stand Sit to Stand: Min guard         General transfer comment: close guard for safety. VCs safety, hand/LE placement.   Ambulation/Gait Ambulation/Gait assistance: Min guard Ambulation Distance (Feet): 100 Feet Assistive device: Rolling walker (2 wheeled) Gait Pattern/deviations: Step-to pattern;Antalgic     General Gait Details: VCs safety, sequence. close guard for safety.   Stairs Stairs: Yes Stairs assistance: Min guard Stair Management: Step to pattern;One rail Right;Sideways Number of Stairs: 2 General stair comments: VCs safety, technique, sequence. Close guard for safety.    Wheelchair Mobility    Modified Rankin (Stroke Patients Only)       Balance                                            Cognition Arousal/Alertness: Awake/alert Behavior During Therapy: WFL  for tasks assessed/performed Overall Cognitive Status: Within Functional Limits for tasks assessed                                        Exercises   General Comments        Pertinent Vitals/Pain Pain Assessment: 0-10 Pain Score: 6  Pain Location: R knee Pain Descriptors / Indicators: Sore;Aching Pain Intervention(s): Monitored during session;Repositioned;Ice applied    Home Living                      Prior Function            PT Goals (current goals can now be found in the care plan section) Progress towards PT goals: Progressing toward goals    Frequency    7X/week      PT Plan Current plan remains appropriate    Co-evaluation              AM-PAC PT "6 Clicks" Daily Activity  Outcome Measure  Difficulty turning over in bed (including adjusting bedclothes, sheets and blankets)?: A Little Difficulty moving from lying on back to sitting on the side of the bed? : A Little Difficulty sitting down on and standing up from a chair with arms (e.g., wheelchair, bedside commode, etc,.)?: A Little Help needed moving to and from  a bed to chair (including a wheelchair)?: A Little Help needed walking in hospital room?: A Little Help needed climbing 3-5 steps with a railing? : A Little 6 Click Score: 18    End of Session Equipment Utilized During Treatment: Gait belt Activity Tolerance: Patient tolerated treatment well Patient left: in bed;with call bell/phone within reach;with family/visitor present   PT Visit Diagnosis: Pain;Difficulty in walking, not elsewhere classified (R26.2) Pain - Right/Left: Right Pain - part of body: Knee     Time: 1610-9604 PT Time Calculation (min) (ACUTE ONLY): 11 min  Charges:  $Gait Training: 8-22 mins $Therapeutic Exercise: 8-22 mins                    G Codes:         Rebeca Alert, MPT Pager: [phone]

## 2018-03-31 NOTE — Discharge Summary (Addendum)
Physician Discharge Summary   Patient ID: Jeremy Calderon MRN: [id number] DOB/AGE: 06-Oct-1956 62 y.o.  Admit date: 03/29/2018 Discharge date: 03/31/2018  Primary Diagnosis: Primary osteoarthritis right knee   Admission Diagnoses:  Past Medical History:  Diagnosis Date  . Arthritis    knees, shoulders, hips, neck.s/p LTKA-'12  . Dyslipidemia   . GERD (gastroesophageal reflux disease)   . H/O seasonal allergies    tx. OTC Loratadine  . Hypertension    Discharge Diagnoses:   Principal Problem:   OA (osteoarthritis) of knee  Estimated body mass index is 36.33 kg/m as calculated from the following:   Height as of this encounter: 5' 9"  (1.753 m).   Weight as of this encounter: 111.6 kg (246 lb).  Procedure:  Procedure(s) (LRB): RIGHT TOTAL KNEE ARTHROPLASTY (Right)   Consults: None  HPI: The patient's RIGHT knee has progressively gotten worse with time. He has significant dysfunction in that knee for some time now. He has had injections multiple times in the knee and is not as effective as they used to be. Alleviating factors include narcotics which he takes hydrocodone. He is at a stage now of the knee is essentially taken over his life and he like to go ahead and get it fixed. He has had a very successful LEFT total knee done in 2012 and it's doing well.  He has end-stage arthritis of the RIGHT knee with progressively worsening pain and dysfunction. Injections are no longer of benefit. This point the most predictable means of improving pain and function is total knee arthroplasty. We discussed the difference is now compared to when he had the other one done over a decade ago. We discussed the differences in pain management and rehab. He is encouraged and is ready to go ahead and proceed with a RIGHT total knee. He was scheduled for 03/01/18 but has been rescheduled for 03/29/18 and is now ready to proceed with surgery.    Laboratory Data: Admission on 03/29/2018  Component Date Value  Ref Range Status  . WBC 03/30/2018 15.8* 4.0 - 10.5 K/uL Final  . RBC 03/30/2018 4.25  4.22 - 5.81 MIL/uL Final  . Hemoglobin 03/30/2018 13.5  13.0 - 17.0 g/dL Final  . HCT 03/30/2018 39.9  39.0 - 52.0 % Final  . MCV 03/30/2018 93.9  78.0 - 100.0 fL Final  . MCH 03/30/2018 31.8  26.0 - 34.0 pg Final  . MCHC 03/30/2018 33.8  30.0 - 36.0 g/dL Final  . RDW 03/30/2018 12.8  11.5 - 15.5 % Final  . Platelets 03/30/2018 174  150 - 400 K/uL Final   Performed at [facility], [place] [address], [place], [place] [address]  . Sodium 03/30/2018 137  135 - 145 mmol/L Final  . Potassium 03/30/2018 4.5  3.5 - 5.1 mmol/L Final  . Chloride 03/30/2018 104  101 - 111 mmol/L Final  . CO2 03/30/2018 25  22 - 32 mmol/L Final  . Glucose, Bld 03/30/2018 124* 65 - 99 mg/dL Final  . BUN 03/30/2018 12  6 - 20 mg/dL Final  . Creatinine, Ser 03/30/2018 0.74  0.61 - 1.24 mg/dL Final  . Calcium 03/30/2018 8.8* 8.9 - 10.3 mg/dL Final  . GFR calc non Af Amer 03/30/2018 >60  >60 mL/min Final  . GFR calc Af Amer 03/30/2018 >60  >60 mL/min Final   Comment: (NOTE) The eGFR has been calculated using the CKD EPI equation. This calculation has not been validated in all clinical situations. eGFR's persistently <60  mL/min signify possible Chronic Kidney Disease.   Georgiann Hahn gap 03/30/2018 8  5 - 15 Final   Performed at [facility], [place] [address] [place], [place] [address]  . WBC 03/31/2018 16.4* 4.0 - 10.5 K/uL Final  . RBC 03/31/2018 4.08* 4.22 - 5.81 MIL/uL Final  . Hemoglobin 03/31/2018 12.8* 13.0 - 17.0 g/dL Final  . HCT 03/31/2018 38.2* 39.0 - 52.0 % Final  . MCV 03/31/2018 93.6  78.0 - 100.0 fL Final  . MCH 03/31/2018 31.4  26.0 - 34.0 pg Final  . MCHC 03/31/2018 33.5  30.0 - 36.0 g/dL Final  . RDW 03/31/2018 12.9  11.5 - 15.5 % Final  . Platelets 03/31/2018 178  150 - 400 K/uL Final   Performed at [facility], [place] [address], [place], [place] [address]  .  Sodium 03/31/2018 141  135 - 145 mmol/L Final  . Potassium 03/31/2018 3.6  3.5 - 5.1 mmol/L Final   Comment: DELTA CHECK NOTED REPEATED TO VERIFY   . Chloride 03/31/2018 105  101 - 111 mmol/L Final  . CO2 03/31/2018 27  22 - 32 mmol/L Final  . Glucose, Bld 03/31/2018 108* 65 - 99 mg/dL Final  . BUN 03/31/2018 14  6 - 20 mg/dL Final  . Creatinine, Ser 03/31/2018 0.71  0.61 - 1.24 mg/dL Final  . Calcium 03/31/2018 9.1  8.9 - 10.3 mg/dL Final  . GFR calc non Af Amer 03/31/2018 >60  >60 mL/min Final  . GFR calc Af Amer 03/31/2018 >60  >60 mL/min Final   Comment: (NOTE) The eGFR has been calculated using the CKD EPI equation. This calculation has not been validated in all clinical situations. eGFR's persistently <60 mL/min signify possible Chronic Kidney Disease.   Georgiann Hahn gap 03/31/2018 9  5 - 15 Final   Performed at [facility], [place] [address] [place], [place] [address]  Hospital Outpatient Visit on 03/23/2018  Component Date Value Ref Range Status  . aPTT 03/23/2018 31  24 - 36 seconds Final   Performed at [facility], [place] [address] [place], [place] [address]  . WBC 03/23/2018 7.1  4.0 - 10.5 K/uL Final  . RBC 03/23/2018 4.86  4.22 - 5.81 MIL/uL Final  . Hemoglobin 03/23/2018 15.8  13.0 - 17.0 g/dL Final  . HCT 03/23/2018 45.7  39.0 - 52.0 % Final  . MCV 03/23/2018 94.0  78.0 - 100.0 fL Final  . MCH 03/23/2018 32.5  26.0 - 34.0 pg Final  . MCHC 03/23/2018 34.6  30.0 - 36.0 g/dL Final  . RDW 03/23/2018 12.9  11.5 - 15.5 % Final  . Platelets 03/23/2018 181  150 - 400 K/uL Final   Performed at [facility], [place] [address], [place], [place] [address]  . Sodium 03/23/2018 141  135 - 145 mmol/L Final  . Potassium 03/23/2018 4.5  3.5 - 5.1 mmol/L Final  . Chloride 03/23/2018 105  101 - 111 mmol/L Final  . CO2 03/23/2018 27  22 - 32 mmol/L Final  . Glucose, Bld 03/23/2018 106* 65 - 99 mg/dL Final  . BUN 03/23/2018 9  6 - 20  mg/dL Final  . Creatinine, Ser 03/23/2018 0.90  0.61 - 1.24 mg/dL Final  . Calcium 03/23/2018 9.7  8.9 - 10.3 mg/dL Final  . Total Protein 03/23/2018 7.4  6.5 - 8.1 g/dL Final  . Albumin 03/23/2018 4.1  3.5 - 5.0 g/dL Final  . AST 03/23/2018 22  15 - 41 U/L Final  . ALT 03/23/2018 29  17 - 63 U/L Final  . Alkaline Phosphatase 03/23/2018 70  38 - 126 U/L Final  . Total Bilirubin 03/23/2018 0.4  0.3 - 1.2 mg/dL Final  . GFR calc non Af Amer 03/23/2018 >60  >60 mL/min Final  . GFR calc Af Amer 03/23/2018 >60  >60 mL/min Final   Comment: (NOTE) The eGFR has been calculated using the CKD EPI equation. This calculation has not been validated in all clinical situations. eGFR's persistently <60 mL/min signify possible Chronic Kidney Disease.   Georgiann Hahn gap 03/23/2018 9  5 - 15 Final   Performed at [facility], [place] [address] [place], [place] [address]  . Prothrombin Time 03/23/2018 12.9  11.4 - 15.2 seconds Final  . INR 03/23/2018 0.98   Final   Performed at [facility], [place] [address], [place], [place] [address]  . ABO/RH(D) 03/23/2018 A POS   Final  . Antibody Screen 03/23/2018 NEG   Final  . Sample Expiration 03/23/2018 04/01/2018   Final  . Extend sample reason 03/23/2018    Final                   Value:NO TRANSFUSIONS OR PREGNANCY IN THE PAST 3 MONTHS Performed at [facility], [place] [address] [place], [place] [address]   . MRSA, PCR 03/23/2018 NEGATIVE  NEGATIVE Final  . Staphylococcus aureus 03/23/2018 NEGATIVE  NEGATIVE Final   Comment: (NOTE) The Xpert SA Assay (FDA approved for NASAL specimens in patients 61 years of age and older), is one component of a comprehensive surveillance program. It is not intended to diagnose infection nor to guide or monitor treatment. Performed at [facility], [place] [address] [place], [place] [address]   Hospital Outpatient Visit on 02/23/2018  Component Date Value  Ref Range Status  . MRSA, PCR 02/23/2018 NEGATIVE  NEGATIVE Final  . Staphylococcus aureus 02/23/2018 POSITIVE* NEGATIVE Final   Comment: (NOTE) The Xpert SA Assay (FDA approved for NASAL specimens in patients 36 years of age and older), is one component of a comprehensive surveillance program. It is not intended to diagnose infection nor to guide or monitor treatment. Performed at [facility], [place] [address] [place], [place] [address]   . aPTT 02/23/2018 31  24 - 36 seconds Final   Performed at [facility], [place] [address] [place], [place] [address]  . WBC 02/23/2018 8.4  4.0 - 10.5 K/uL Final  . RBC 02/23/2018 4.77  4.22 - 5.81 MIL/uL Final  . Hemoglobin 02/23/2018 15.4  13.0 - 17.0 g/dL Final  . HCT 02/23/2018 45.1  39.0 - 52.0 % Final  . MCV 02/23/2018 94.5  78.0 - 100.0 fL Final  . MCH 02/23/2018 32.3  26.0 - 34.0 pg Final  . MCHC 02/23/2018 34.1  30.0 - 36.0 g/dL Final  . RDW 02/23/2018 12.9  11.5 - 15.5 % Final  . Platelets 02/23/2018 182  150 - 400 K/uL Final   Performed at [facility], [place] [address] [place],  [address]  . Sodium 02/23/2018 139  135 - 145 mmol/L Final  . Potassium 02/23/2018 4.3  3.5 - 5.1 mmol/L Final  . Chloride 02/23/2018 104  101 - 111 mmol/L Final  . CO2 02/23/2018 29  22 - 32 mmol/L Final  . Glucose, Bld 02/23/2018 100* 65 - 99 mg/dL Final  . BUN 02/23/2018 9  6 - 20 mg/dL Final  . Creatinine, Ser 02/23/2018 0.83  0.61 - 1.24 mg/dL Final  . Calcium 02/23/2018 9.4  8.9 - 10.3 mg/dL Final  . Total Protein 02/23/2018 7.3  6.5 - 8.1 g/dL Final  . Albumin 02/23/2018 4.1  3.5 - 5.0 g/dL Final  . AST 02/23/2018 23  15 - 41 U/L Final  . ALT 02/23/2018 25  17 - 63 U/L Final  . Alkaline Phosphatase 02/23/2018 72  38 - 126 U/L Final  . Total Bilirubin 02/23/2018 0.6  0.3 - 1.2 mg/dL Final  . GFR calc non Af Amer 02/23/2018 >60  >60 mL/min Final  . GFR calc Af Amer 02/23/2018 >60  >60 mL/min  Final   Comment: (NOTE) The eGFR has been calculated using the CKD EPI equation. This calculation has not been validated in all clinical situations. eGFR's persistently <60 mL/min signify possible Chronic Kidney Disease.   Georgiann Hahn gap 02/23/2018 6  5 - 15 Final   Performed at [facility], [place] [address] [place], [place] [address]  . Prothrombin Time 02/23/2018 12.8  11.4 - 15.2 seconds Final  . INR 02/23/2018 0.97   Final   Performed at [facility], [place] [address] [place], [place] [address]  . ABO/RH(D) 02/23/2018 A POS   Final  . Antibody Screen 02/23/2018 NEG   Final  . Sample Expiration 02/23/2018 03/04/2018   Final  . Extend sample reason 02/23/2018    Final                   Value:NO TRANSFUSIONS OR PREGNANCY IN THE PAST 3 MONTHS Performed at [facility], [place] [address], [place], [place] [address]       Hospital Course: ADMIR CANDELAS is a 62 y.o. who was admitted to [facility]. They were brought to the operating room on 03/29/2018 and underwent Procedure(s): RIGHT TOTAL KNEE ARTHROPLASTY.  Patient tolerated the procedure well and was later transferred to the recovery room and then to the orthopaedic floor for postoperative care.  They were given PO and IV analgesics for pain control following their surgery.  They were given 24 hours of postoperative antibiotics of  Anti-infectives (From admission, onward)   Start     Dose/Rate Route Frequency Ordered Stop   03/29/18 2000  ceFAZolin (ANCEF) IVPB 2g/100 mL premix     2 g 200 mL/hr over 30 Minutes Intravenous Every 6 hours 03/29/18 1545 03/30/18 0220   03/29/18 1020  ceFAZolin (ANCEF) IVPB 2g/100 mL premix     2 g 200 mL/hr over 30 Minutes Intravenous On call to O.R. 03/29/18 1020 03/29/18 1304     and started on DVT prophylaxis in the form of Xarelto.   PT and OT were ordered for total joint protocol.  Discharge planning consulted to help with postop  disposition and equipment needs.  Patient had a good night on the evening of surgery.  They started to get up OOB with therapy on day one. Hemovac drain was pulled without difficulty.  Continued to work with therapy into day two.  Dressing was changed on day two and the incision was clean and dry.  The patient had progressed with therapy and meeting their goals.  Incision was healing well.  Patient was seen in rounds and was ready to go home.   Diet: Cardiac diet Activity:WBAT Follow-up:in 2 weeks Disposition - Home Discharged Condition: stable   Discharge Instructions    Call MD /  Call 911   Complete by:  As directed    If you experience chest pain or shortness of breath, CALL 911 and be transported to the hospital emergency room.  If you develope a fever above 101 F, pus (white drainage) or increased drainage or redness at the wound, or calf pain, call your surgeon's office.   Constipation Prevention   Complete by:  As directed    Drink plenty of fluids.  Prune juice may be helpful.  You may use a stool softener, such as Colace (over the counter) 100 mg twice a day.  Use MiraLax (over the counter) for constipation as needed.   Diet - low sodium heart healthy   Complete by:  As directed    Do not put a pillow under the knee. Place it under the heel.   Complete by:  As directed    Increase activity slowly as tolerated   Complete by:  As directed    TED hose   Complete by:  As directed    Use stockings (TED hose) for 3 weeks on operative leg(s).  You may remove them at night for sleeping.     Allergies as of 03/31/2018   No Known Allergies     Medication List    STOP taking these medications   cyclobenzaprine 10 MG tablet Commonly known as:  FLEXERIL   fish oil-omega-3 fatty acids 1000 MG capsule   GLUCOSAMINE PO   HYDROcodone-acetaminophen 10-325 MG tablet Commonly known as:  NORCO   Magnesium 250 MG Tabs     TAKE these medications   amLODipine 10 MG tablet Commonly  known as:  NORVASC Take 10 mg by mouth every morning.   BIOFREEZE EX Apply 1 application topically daily as needed (shoulder pain).   CLEAR EYES OP Place 1 drop into both eyes daily as needed (allergies).   diphenhydrAMINE 25 MG tablet Commonly known as:  BENADRYL Take 50 mg by mouth daily as needed for allergies.   fluticasone 50 MCG/ACT nasal spray Commonly known as:  FLONASE Place 2 sprays into both nostrils daily as needed for allergies or rhinitis.   gabapentin 100 MG capsule Commonly known as:  NEURONTIN Take 100 mg by mouth at bedtime as needed for mild pain or sleep.   hydrochlorothiazide 25 MG tablet Commonly known as:  HYDRODIURIL Take 25 mg by mouth daily.   losartan 50 MG tablet Commonly known as:  COZAAR Take 100 mg by mouth daily.   methocarbamol 500 MG tablet Commonly known as:  ROBAXIN Take 1 tablet (500 mg total) by mouth every 6 (six) hours as needed for muscle spasms.   oxyCODONE 5 MG immediate release tablet Commonly known as:  Oxy IR/ROXICODONE Take 1-2 tablets (5-10 mg total) by mouth every 4 (four) hours as needed for moderate pain or severe pain.   pseudoephedrine-acetaminophen 30-500 MG Tabs tablet Commonly known as:  TYLENOL SINUS Take 2 tablets by mouth 2 (two) times daily as needed (sinus congestion).   rivaroxaban 10 MG Tabs tablet Commonly known as:  XARELTO Take 1 tablet (10 mg total) by mouth daily with breakfast.   rosuvastatin 5 MG tablet Commonly known as:  CRESTOR Take 5 mg by mouth every evening.   traMADol 50 MG tablet Commonly known as:  ULTRAM Take 1-2 tablets (50-100 mg total) by mouth every 6 (six) hours as needed (mild pain).            Durable Medical Equipment  (From admission, onward)  Start     Ordered   03/30/18 1104  For home use only DME Walker rolling  Once    Question:  Patient needs a walker to treat with the following condition  Answer:  S/P knee surgery   03/30/18 1103     Follow-up  Information    Gaynelle Arabian, MD. Schedule an appointment as soon as possible for a visit on 04/13/2018.   Specialty:  Orthopedic Surgery Contact information: [address] [place] [address] [place] [address] [phone]        [facility] - Dme Follow up.   Why:  walker Contact information: [address] [place] [address] [phone]           Signed: Ardeen Jourdain, PA-C Orthopaedic Surgery 03/31/2018, 11:02 AM

## 2018-03-31 NOTE — Progress Notes (Signed)
Reviewed discharge education, prescriptions and information. Patient verbalizes understanding and has no questions at this time.

## 2018-03-31 NOTE — Progress Notes (Signed)
Physical Therapy Treatment Patient Details Name: Jeremy Calderon MRN: [id number] DOB: [birth year] Today's Date: 03/31/2018    History of Present Illness 62 yo male s/p R TKA 03/19/18    PT Comments    ROM exercises only this a.m. Pt having pain and is nearing next pain med dose. Will have a 2nd session after pain meds to complete PT goals.    Follow Up Recommendations  Follow surgeon's recommendation for DC plan and follow-up therapies     Equipment Recommendations  Rolling walker with 5" wheels    Recommendations for Other Services       Precautions / Restrictions Precautions Precautions: Fall Required Braces or Orthoses: Knee Immobilizer - Right Knee Immobilizer - Right: Discontinue once straight leg raise with < 10 degree lag Restrictions Weight Bearing Restrictions: No Other Position/Activity Restrictions: WBAT    Mobility  Bed Mobility                  Transfers                    Ambulation/Gait                 Stairs             Wheelchair Mobility    Modified Rankin (Stroke Patients Only)       Balance                                            Cognition Arousal/Alertness: Awake/alert Behavior During Therapy: WFL for tasks assessed/performed Overall Cognitive Status: Within Functional Limits for tasks assessed                                        Exercises Total Joint Exercises Ankle Circles/Pumps: AROM;Both;10 reps;Supine Quad Sets: AROM;Both;10 reps;Supine Heel Slides: AAROM;Right;10 reps;Supine Hip ABduction/ADduction: AROM;Right;10 reps;Supine Straight Leg Raises: AROM;AAROM;Right;10 reps;Supine Goniometric ROM: ~10-55 degrees    General Comments        Pertinent Vitals/Pain Pain Assessment: 0-10 Pain Score: 7  Pain Location: R knee Pain Descriptors / Indicators: Sore;Aching Pain Intervention(s): Monitored during session    Home Living                       Prior Function            PT Goals (current goals can now be found in the care plan section) Progress towards PT goals: Progressing toward goals    Frequency    7X/week      PT Plan Current plan remains appropriate    Co-evaluation              AM-PAC PT "6 Clicks" Daily Activity  Outcome Measure  Difficulty turning over in bed (including adjusting bedclothes, sheets and blankets)?: A Little Difficulty moving from lying on back to sitting on the side of the bed? : A Little Difficulty sitting down on and standing up from a chair with arms (e.g., wheelchair, bedside commode, etc,.)?: A Little Help needed moving to and from a bed to chair (including a wheelchair)?: A Little Help needed walking in hospital room?: A Little Help needed climbing 3-5 steps with a railing? : A Little 6 Click Score: 18    End of Session Equipment Utilized  During Treatment: Gait belt;Right knee immobilizer Activity Tolerance: Patient tolerated treatment well Patient left: in bed;with call bell/phone within reach;with family/visitor present   PT Visit Diagnosis: Pain;Difficulty in walking, not elsewhere classified (R26.2) Pain - Right/Left: Right Pain - part of body: Knee     Time: 5621-3086 PT Time Calculation (min) (ACUTE ONLY): 16 min  Charges:  $Therapeutic Exercise: 8-22 mins                    G Codes:          Rebeca Alert, MPT Pager: [phone]

## 2020-04-09 ENCOUNTER — Ambulatory Visit: Payer: 59 | Attending: Internal Medicine

## 2020-04-09 DIAGNOSIS — Z23 Encounter for immunization: Secondary | ICD-10-CM

## 2020-04-09 NOTE — Progress Notes (Signed)
   Covid-19 Vaccination Clinic  Name:  Jeremy Calderon    MRN: [id number] DOB: 04-Feb-1956  04/09/2020  [name] was observed post Covid-19 immunization for 15 minutes without incident. He was provided with Vaccine Information Sheet and instruction to access the V-Safe system.   [name] was instructed to call 911 with any severe reactions post vaccine: Marland Kitchen Difficulty breathing  . Swelling of face and throat  . A fast heartbeat  . A bad rash all over body  . Dizziness and weakness   Immunizations Administered    Name Date Dose VIS Date Route   Pfizer COVID-19 Vaccine 04/09/2020  2:10 PM 0.3 mL 01/25/2019 Intramuscular   Manufacturer: [employer], [employer]   Lot: [id number]   NDC: [address]-6

## 2020-05-01 ENCOUNTER — Ambulatory Visit: Payer: 59 | Attending: Internal Medicine

## 2020-05-01 DIAGNOSIS — Z23 Encounter for immunization: Secondary | ICD-10-CM

## 2020-05-01 NOTE — Progress Notes (Signed)
   Covid-19 Vaccination Clinic  Name:  Jeremy Calderon    MRN: [id number] DOB: [birth year]  05/01/2020  Mr. On was observed post Covid-19 immunization for 15 minutes without incident. He was provided with Vaccine Information Sheet and instruction to access the V-Safe system.   Mr. [name] was instructed to call 911 with any severe reactions post vaccine: Marland Kitchen Difficulty breathing  . Swelling of face and throat  . A fast heartbeat  . A bad rash all over body  . Dizziness and weakness   Immunizations Administered    Name Date Dose VIS Date Route   Pfizer COVID-19 Vaccine 05/01/2020  9:57 AM 0.3 mL 01/25/2019 Intramuscular   Manufacturer: [employer], [employer]   Lot: [id number]   NDC: [address]-2
